# Patient Record
Sex: Female | Born: 1945
Health system: Southern US, Community
[De-identification: ages and names within clinical notes are randomized; demographics above are authoritative.]

## PROBLEM LIST (undated history)

## (undated) DIAGNOSIS — I10 Essential (primary) hypertension: Secondary | ICD-10-CM

## (undated) DIAGNOSIS — K219 Gastro-esophageal reflux disease without esophagitis: Secondary | ICD-10-CM

## (undated) DIAGNOSIS — J302 Other seasonal allergic rhinitis: Secondary | ICD-10-CM

## (undated) DIAGNOSIS — M419 Scoliosis, unspecified: Secondary | ICD-10-CM

## (undated) DIAGNOSIS — F419 Anxiety disorder, unspecified: Secondary | ICD-10-CM

## (undated) DIAGNOSIS — M353 Polymyalgia rheumatica: Secondary | ICD-10-CM

## (undated) DIAGNOSIS — J45909 Unspecified asthma, uncomplicated: Secondary | ICD-10-CM

## (undated) DIAGNOSIS — E785 Hyperlipidemia, unspecified: Secondary | ICD-10-CM

## (undated) DIAGNOSIS — M1990 Unspecified osteoarthritis, unspecified site: Secondary | ICD-10-CM

## (undated) HISTORY — DX: Polymyalgia rheumatica: M35.3

## (undated) HISTORY — PX: ABDOMINAL HYSTERECTOMY: SUR658

## (undated) HISTORY — DX: Unspecified asthma, uncomplicated: J45.909

## (undated) HISTORY — PX: HEMORRHOID SURGERY: SHX153

## (undated) HISTORY — DX: Anxiety disorder, unspecified: F41.9

## (undated) HISTORY — PX: KNEE ARTHROSCOPY: SUR90

## (undated) HISTORY — DX: Hyperlipidemia, unspecified: E78.5

## (undated) HISTORY — DX: Scoliosis, unspecified: M41.9

## (undated) HISTORY — DX: Unspecified osteoarthritis, unspecified site: M19.90

## (undated) HISTORY — DX: Essential (primary) hypertension: I10

## (undated) HISTORY — DX: Other seasonal allergic rhinitis: J30.2

## (undated) HISTORY — DX: Gastro-esophageal reflux disease without esophagitis: K21.9

## (undated) HISTORY — PX: NASAL SINUS SURGERY: SHX719

---

## 1998-10-03 ENCOUNTER — Other Ambulatory Visit: Admission: RE | Admit: 1998-10-03 | Discharge: 1998-10-03 | Payer: Self-pay | Admitting: Obstetrics and Gynecology

## 1999-03-20 ENCOUNTER — Encounter: Admission: RE | Admit: 1999-03-20 | Discharge: 1999-03-20 | Payer: Self-pay | Admitting: Obstetrics & Gynecology

## 1999-03-20 ENCOUNTER — Encounter: Payer: Self-pay | Admitting: Emergency Medicine

## 1999-04-23 ENCOUNTER — Ambulatory Visit (HOSPITAL_BASED_OUTPATIENT_CLINIC_OR_DEPARTMENT_OTHER): Admission: RE | Admit: 1999-04-23 | Discharge: 1999-04-23 | Payer: Self-pay | Admitting: Otolaryngology

## 2000-04-07 ENCOUNTER — Encounter: Payer: Self-pay | Admitting: Emergency Medicine

## 2000-04-07 ENCOUNTER — Encounter: Admission: RE | Admit: 2000-04-07 | Discharge: 2000-04-07 | Payer: Self-pay | Admitting: Emergency Medicine

## 2000-04-07 ENCOUNTER — Ambulatory Visit (HOSPITAL_COMMUNITY): Admission: RE | Admit: 2000-04-07 | Discharge: 2000-04-07 | Payer: Self-pay | Admitting: Otolaryngology

## 2000-04-07 ENCOUNTER — Encounter: Payer: Self-pay | Admitting: Otolaryngology

## 2000-08-02 ENCOUNTER — Emergency Department (HOSPITAL_COMMUNITY): Admission: EM | Admit: 2000-08-02 | Discharge: 2000-08-02 | Payer: Self-pay | Admitting: Emergency Medicine

## 2000-08-04 ENCOUNTER — Emergency Department (HOSPITAL_COMMUNITY): Admission: EM | Admit: 2000-08-04 | Discharge: 2000-08-04 | Payer: Self-pay | Admitting: Internal Medicine

## 2000-08-18 ENCOUNTER — Encounter: Payer: Self-pay | Admitting: Otolaryngology

## 2000-08-18 ENCOUNTER — Encounter: Admission: RE | Admit: 2000-08-18 | Discharge: 2000-08-18 | Payer: Self-pay | Admitting: Otolaryngology

## 2001-06-05 ENCOUNTER — Encounter: Admission: RE | Admit: 2001-06-05 | Discharge: 2001-06-05 | Payer: Self-pay | Admitting: Obstetrics and Gynecology

## 2001-06-05 ENCOUNTER — Encounter: Payer: Self-pay | Admitting: Obstetrics and Gynecology

## 2001-06-11 ENCOUNTER — Encounter: Payer: Self-pay | Admitting: Otolaryngology

## 2001-06-11 ENCOUNTER — Encounter: Admission: RE | Admit: 2001-06-11 | Discharge: 2001-06-11 | Payer: Self-pay | Admitting: Otolaryngology

## 2001-07-27 ENCOUNTER — Ambulatory Visit (HOSPITAL_BASED_OUTPATIENT_CLINIC_OR_DEPARTMENT_OTHER): Admission: RE | Admit: 2001-07-27 | Discharge: 2001-07-27 | Payer: Self-pay | Admitting: Otolaryngology

## 2002-05-18 ENCOUNTER — Encounter: Payer: Self-pay | Admitting: Otolaryngology

## 2002-05-18 ENCOUNTER — Encounter: Admission: RE | Admit: 2002-05-18 | Discharge: 2002-05-18 | Payer: Self-pay | Admitting: Otolaryngology

## 2002-06-28 ENCOUNTER — Encounter: Admission: RE | Admit: 2002-06-28 | Discharge: 2002-06-28 | Payer: Self-pay | Admitting: Obstetrics and Gynecology

## 2002-06-28 ENCOUNTER — Encounter: Payer: Self-pay | Admitting: Obstetrics and Gynecology

## 2003-06-29 ENCOUNTER — Encounter: Admission: RE | Admit: 2003-06-29 | Discharge: 2003-06-29 | Payer: Self-pay | Admitting: Obstetrics and Gynecology

## 2003-08-23 ENCOUNTER — Ambulatory Visit (HOSPITAL_COMMUNITY): Admission: RE | Admit: 2003-08-23 | Discharge: 2003-08-23 | Payer: Self-pay | Admitting: Emergency Medicine

## 2003-09-06 ENCOUNTER — Observation Stay (HOSPITAL_COMMUNITY): Admission: RE | Admit: 2003-09-06 | Discharge: 2003-09-07 | Payer: Self-pay | Admitting: Obstetrics and Gynecology

## 2004-10-11 ENCOUNTER — Emergency Department (HOSPITAL_COMMUNITY): Admission: EM | Admit: 2004-10-11 | Discharge: 2004-10-12 | Payer: Self-pay | Admitting: Emergency Medicine

## 2004-10-24 ENCOUNTER — Encounter: Admission: RE | Admit: 2004-10-24 | Discharge: 2004-10-24 | Payer: Self-pay | Admitting: Obstetrics and Gynecology

## 2005-10-15 ENCOUNTER — Encounter: Admission: RE | Admit: 2005-10-15 | Discharge: 2005-10-15 | Payer: Self-pay | Admitting: Emergency Medicine

## 2005-10-28 ENCOUNTER — Encounter: Admission: RE | Admit: 2005-10-28 | Discharge: 2005-10-28 | Payer: Self-pay | Admitting: Obstetrics and Gynecology

## 2006-08-12 ENCOUNTER — Encounter: Admission: RE | Admit: 2006-08-12 | Discharge: 2006-08-12 | Payer: Self-pay | Admitting: Rheumatology

## 2006-09-01 ENCOUNTER — Encounter: Admission: RE | Admit: 2006-09-01 | Discharge: 2006-09-01 | Payer: Self-pay | Admitting: Rheumatology

## 2006-10-30 ENCOUNTER — Encounter: Admission: RE | Admit: 2006-10-30 | Discharge: 2006-10-30 | Payer: Self-pay | Admitting: Obstetrics and Gynecology

## 2008-01-11 ENCOUNTER — Encounter: Admission: RE | Admit: 2008-01-11 | Discharge: 2008-01-11 | Payer: Self-pay | Admitting: Emergency Medicine

## 2009-01-12 ENCOUNTER — Encounter: Admission: RE | Admit: 2009-01-12 | Discharge: 2009-01-12 | Payer: Self-pay | Admitting: Emergency Medicine

## 2010-02-05 ENCOUNTER — Encounter: Admission: RE | Admit: 2010-02-05 | Discharge: 2010-02-05 | Payer: Self-pay | Admitting: Obstetrics and Gynecology

## 2010-05-01 ENCOUNTER — Other Ambulatory Visit: Payer: Self-pay | Admitting: Rheumatology

## 2010-05-01 DIAGNOSIS — M25562 Pain in left knee: Secondary | ICD-10-CM

## 2010-05-07 ENCOUNTER — Ambulatory Visit
Admission: RE | Admit: 2010-05-07 | Discharge: 2010-05-07 | Disposition: A | Discharge status: Home / Self Care | Payer: BC Managed Care – PPO | Source: Ambulatory Visit | Attending: Rheumatology | Admitting: Rheumatology

## 2010-05-07 DIAGNOSIS — M25562 Pain in left knee: Secondary | ICD-10-CM

## 2010-08-03 NOTE — Op Note (Signed)
[place]. [facility]  Patient:    Joanna Cortez                         MRN: [id number] Proc. Date: 04/23/99 Adm. Date:  [id number] Attending:  Susy Frizzle CC:         Oley Balm. Georgina Pillion, M.D.                           Operative Report  PREOPERATIVE DIAGNOSIS:  Chronic maxillary ethmoid and frontal sinusitis.  POSTOPERATIVE DIAGNOSIS:  Chronic maxillary ethmoid and frontal sinusitis.  PROCEDURES: 1. Bilateral endoscopic frontal sinusotomy. 2. Right maxillary sinus endoscopy with removal of polypoid tissue.  SURGEON:  Jefry H. Pollyann Kennedy, M.D.  ANESTHESIA:  General endotracheal.  COMPLICATIONS:  None.  FINDINGS:  Diffuse polypoid degeneration of the maxillary sinus mucosa on the right side, with bilateral polypoid degeneration of the interethmoid area, and near-complete obstruction of the frontal sinus drainage pathway bilaterally. inspissated mucus filling the right sphenoid sinus.  REFERRING PHYSICIAN:  Oley Balm. Georgina Pillion, M.D.  HISTORY:  A 65 year old with a long history of chronic sinus complaints, chronic sinus infection and asthma, status post extensive sinus surgery and polypectomy  about 2-1/2 years ago; with persistent chronic drainage.  Risks, benefits and alternatives and complications of the procedure were explained to the patient. She seemed to understand and agreed to surgery.  DESCRIPTION OF PROCEDURE:  The patient was taken to the operating room and placed on the operating table in supine position.  Following the induction of general endotracheal anesthesia the patient was draped in the standard fashion. Oxymetazoline spray was used in the nose preoperatively.  The Insta-Track head ear was positioned on the patients head and straight suction probe was connected and calibrated to the system.  Xylocaine 1% with epinephrine was infiltrated in the  superior and posterior attachments of the middle turbinate remnants bilaterally.  1.  BILATERAL ENDOSCOPIC FRONTAL SINUSOTOMY:  Nasal endoscopy revealed the ethmoid cavities were open bilaterally, but there was severe polypoid degeneration of the mucosal surfaces, more so on the right side.  This polypoid tissue was taken down using the suction debrider.  The sphenoidostomy were opened bilaterally, and thick, inspissated mucus was aspirated from the right side.  There were no obstructed ethmoid cells on either side.  The frontal recess area was inspected using 30 and 70-degree endoscopes.  Some polypoid tissue was seen blocking the opening of the frontal sinuses.  On the left side there was a small residual ridge of bone separating the anterior ethmoid area from the frontal sinus ostium. This was taken down using giraffe forceps.  Curved suction was entered into the frontal sinus.  There was no fluid or polypoid material within the frontal sinus. Transillumination was used to assure that the sinus was indeed open.  This was performed on both sides.  On the right side some polypoid tissue was cleaned out of the frontal recess area and the frontal sinus ostium was enlarged using giraffe  forceps.  Care was taken not to damage any of the lining of the ostium.  The frontal sinuses were packed with small pieces of Gelfoam soaked in Cortisporin suspension bilaterally.  At the end of the complete procedure Kennedy ethmoid packs were cut down to about half their normal thickness, and were packed into the frontal sinuses bilaterally.  2. RIGHT MAXILLARY SINUS ENDOSCOPY (with removal of polypoid tissue).  The left  maxillary sinus was wide-open and clear.  The left was filled with polypoid-type tissue.  The ostium itself was cleaned of polypoid tissue, which allowed easy access into the sinus itself.  The suction debrider was used to clean out the polypoid tissue from the antrostomy site.  The suction debrider and giraffe forceps were then used to remove the majority of  the polypoid tissue from the sinus itself. There was minimal bleeding encountered along this dissection.  The nasal cavities were suctioned of blood and secretions, and the pharynx was suctioned of blood nd secretions under direct visualization as well.  The patient was awakened, extubated and transferred to recovery in stable condition.DD:  04/23/99 TD:  04/23/99 Job: [id number]

## 2010-08-03 NOTE — Op Note (Signed)
[name]. [facility]  Patient:    Joanna Cortez, Joanna Cortez Visit Number: [id number] MRN: [id number]          Service Type: DSU Location: [facility] Attending Physician:  Susy Frizzle Dictated by:   Jeannett Senior Pollyann Kennedy, M.D. Proc. Date: 07/27/01 Admit Date:  07/27/2001                             Operative Report  PREOPERATIVE DIAGNOSIS:  Chronic sinusitis.  POSTOPERATIVE DIAGNOSIS:  Chronic sinusitis.  OPERATION PERFORMED:  Nasal endoscopy with minor revision, ethmoidectomy and left antral meatal window.  SURGEON:  Jefry H. Pollyann Kennedy, M.D.  COMPLICATIONS:  None.  ESTIMATED BLOOD LOSS:  15 cc.  OPERATIVE FINDINGS:  Clear open sinus cavities, bilateral maxillary, bilateral frontal, bilateral ethmoid and bilateral sphenoid.  No polypoid disease present today.  Thick cloudy mucoid material within the posterior ethmoid cells bilaterally.  A sample of this was taken for culture, aerobic and anaerobic culture and sensitivity testing.  The patient tolerated the procedure well, was awakened, extubated and transferred to recovery in stable condition.  ANESTHESIA:  INDICATIONS FOR PROCEDURE:  The patient is a 65 year old lady with a history of chronic pansinusitis despite multiple operations and prolonged antibiotic therapy including a two month period of intravenous antibiotic therapy.  The risks, benefits, alternatives and complications of the procedure were explained to the patient, who seemed to understand and agreed to surgery.  DESCRIPTION OF PROCEDURE:  The patient was taken to the operating room and placed on the operating table in the supine position.  Following induction of general endotracheal anesthesia, the face was draped in the standard fashion. Oxymetazoline spray was used preoperatively in the nose.  1% Xylocaine with epinephrine was infiltrated into the middle turbinate remnant on the left side and the lateral nasal wall.  Afrin soaked pledgets were placed  in the nasal and sinus cavities bilaterally.  0 and 30 degree nasal endoscopy was performed.  The above findings were noted.  All sinus cavities were open without obstruction.  There was no polypoid disease.  The thick cloudy mucoid material was suctioned from the  ethmoid sinus on the right side and sent for culture with a Lukens prep.  The left side was simply suctioned out.  Several small fibrotic bands of tissue were taken down using the microdebrider within the ethmoid cavities bilaterally.  None of these were causing any significant obstruction but the feeling was that any advantage that could be obtained by complete opening of the ethmoid cavity could be beneficial.  There was really no bony tissue remaining and no bony work was resected.  The frontal sinuses were widely patent and were inspected using curved long thin suction as well as 30 degree nasal endoscopy.  Sphenoids were wide open bilaterally.  The maxillary sinuses were wide open as well.  On the left side which is where she seems to get the most of the infection and the majority of her symptoms, a nasal antral window was created on the inferior meatus.  1% Xylocaine with epinephrine was infiltrated into the mucosa first.  The curved suction was then entered through the thin bone and back biting forceps and straight through cutting forceps were used to enlarge the new opening.  The inferior turbinate was left in place and was not destabilized.  Afrin pledgets were then placed bilaterally for completion of hemostasis.  The maxillary sinuses bilaterally were packed with Cortisporin ointment.  The patient was then awakened from anesthesia and transferred to recovery in stable condition. Dictated by:   Jeannett Senior Pollyann Kennedy, M.D. Attending Physician:  Susy Frizzle DD:  07/27/01 TD:  07/27/01 Job: [id number]

## 2010-08-03 NOTE — Op Note (Signed)
NAME:  Joanna Cortez, Joanna Cortez NO.:  [id number]   MEDICAL RECORD NO.:  [id number]                   PATIENT TYPE:  OBV   LOCATION:  9319                                 FACILITY:  [facility]   PHYSICIAN:  Zenaida Niece, M.D.             DATE OF BIRTH:  September 27, 1945   DATE OF PROCEDURE:  09/06/2003  DATE OF DISCHARGE:                                 OPERATIVE REPORT   PREOPERATIVE DIAGNOSES:  Pelvic relaxation.   POSTOPERATIVE DIAGNOSES:  Pelvic relaxation.   PROCEDURE:  Anterior and posterior colporrhaphy with enterocele repair and  sacrospinous vaginal vault suspension.   SURGEON:  Zenaida Niece, M.D.   ASSISTANT:  Huel Cote, M.D.   ANESTHESIA:  General endotracheal tube.   ESTIMATED BLOOD LOSS:  100 mL.   FINDINGS:  Grade 2 cystocele and rectocele.  She had an obvious enterocele  and grade 2 vaginal vault prolapse.   DESCRIPTION OF PROCEDURE:  The patient was taken to the operating room and  placed in the dorsal supine position.  General anesthesia was induced and  she was placed in mobile stirrups.  The perineum and vagina were then  prepped and draped in the usual sterile fashion and bladder drained with a  red rubber catheter.  A weighted speculum was inserted into the vagina and  the vaginal apex was grasped with Allis clamps laterally. A small piece of  vaginal tissue was then removed sharply and the vagina was dissected from  the vaginal apex to within 1 cm of the urethral meatus anteriorly in the  midline.  The vagina was then freed up laterally, sharply and bluntly to  mobilize the cystocele.  The cystocele was then reduced with interrupted  sutures of 2-0 Vicryl with good reduction.  Excess vaginal mucosa was  removed and the vagina was then closed from the urethral meatus to the  vaginal cuff with running locking 2-0 Vicryl with good reduction of the  cystocele.   Attention was turned posteriorly.  The hymenal ring was grasped at  a  distance that would allow two fingers to easily pass into the vagina. A  piece of vaginal tissue was then removed at the introitus. The vagina was  then dissected from the hymenal ring to the vaginal apex in the midline.  The vagina was then freed up laterally to mobilize the rectocele.  In doing  so, an obvious enterocele was encountered. This was confirmed by rectal exam  with an over glove.  The enterocele was grasped with Kelly clamps and  entered sharply. The enterocele was mobilized from the surrounding tissue  and a suture of 2-0 silk was placed as high as possible in the enterocele to  close it off. The excess enterocele sac was then removed with  electrocautery.  Attention was turned to the sacrospinous fixation.  The  right rectal pillar was identified and bluntly dissected. The rectum and  perirectal  tissue was pushed medially.  The ischial spine was identified and  all tissue swept off of the sacrospinous ligament which was easily palpated.  Breisky retractors were used and an Allis clamp is used to grasp the  sacrospinous ligament. Two sutures of #1 Prolene were passed through the  ligament, one medial and one lateral to the Allis clamp which was  approximately 2 cm from the ischial spine.  The sutures were seated very  well in the sacrospinous ligament.  The sutures were then passed through the  under portion of the vagina at the vaginal apex with a free Mayo needle.  The lateral stitch was made to be a pulley stitch.  The rectocele was then  reduced with interrupted sutures of 2-0 Vicryl with good reduction. Excess  vaginal mucosa was then removed.  The posterior vaginal mucosa was then  started to be closed in a running locking fashion with 2-0 Vicryl. This was  closed approximately halfway. The pulley stitch was then tied and pulled the  vaginal vault to the sacrospinous ligament. This was done with good  suspension of the vagina. The suture was tied followed by the  other  sacrospinous suture with good suspension of the vagina. The remainder of the  vagina was closed with running locking 2-0 Vicryl with good closure.  Inspection revealed adequate width of the introitus and good suspension of  the vagina to the sacrospinous ligament and good reduction of the cystocele  and rectocele. The vagina was then packed with 2 inch iodoform gauze.  A  Foley catheter was then inserted.  The patient tolerated the procedure well  and was taken down from stirrups, extubated in the operating room and taken  to the recovery room in stable condition. Counts were correct, she received  Ancef 1 g prior to the procedure, she had PAS hose on throughout the  procedure.                                               Zenaida Niece, M.D.    TDM/MEDQ  D:  09/06/2003  T:  09/06/2003  Job:  [id number]

## 2010-08-03 NOTE — H&P (Signed)
NAME:  Joanna Cortez, Joanna Cortez NO.:  [id number]   MEDICAL RECORD NO.:  [id number]                   PATIENT TYPE:  OBV   LOCATION:  NA                                   FACILITY:  [facility]   PHYSICIAN:  Zenaida Niece, M.D.             DATE OF BIRTH:  [birth year]   DATE OF ADMISSION:  DATE OF DISCHARGE:                                HISTORY & PHYSICAL   CHIEF COMPLAINT:  Pelvic relaxation.   HISTORY OF PRESENT ILLNESS:  This is a 65 year old white female, para 3-0-1-  3, whom I saw for an annual exam on April 15 of this year.  At that time she  complained of increasing pelvic pressure and decreasing sexual activity.  On  exam at that point she had a grade 2 cystocele, a grade 3 rectocele, and  vaginal vault prolapse.  All options were discussed with her and she  expressed the desire to proceed with surgical therapy.   PAST OB HISTORY:  Three vaginal deliveries without complications and one  ectopic pregnancy.   PAST SURGICAL HISTORY:  1. Tubal ligation.  2. Right salpingo-oophorectomy.  3. Appendectomy.  4. Total abdominal hysterectomy with left salpingo-oophorectomy.  5. Hemorrhoidectomy.  6. Sinus surgery x3.  7. Treatment of an ectopic pregnancy.   PAST MEDICAL HISTORY:  1. Hypertension.  2. Gastroesophageal reflux disease.  3. Asthma.   CURRENT MEDICATIONS:  1. Zyrtec.  2. Prilosec.  3. Estradiol 1 mg daily.  4. Trazodone.  5. Advair.  6. Diovan.  7. Toprol.  8. Singulair.   ALLERGIES:  ACE INHIBITORS.   SOCIAL HISTORY:  She is married and denies alcohol, tobacco, or drug use.   REVIEW OF SYSTEMS:  She has occasional urinary urge.  She has also recently  had some left leg, right arm, and left face numbness, and has been worked up  for this with an MRI, an echocardiogram, and carotid Dopplers.  She reports  to me that the echo and carotid Dopplers were normal and was waiting for the  results of the MRI.   FAMILY HISTORY:  No GYN or  colon cancer.   PHYSICAL EXAMINATION:  VITAL SIGNS:  Weight is 154, blood pressure 136/80,  pulse 80.  GENERAL:  This is a well-developed, well-nourished, white female in no acute  distress.  NECK:  Supple without lymphadenopathy or thyromegaly.  LUNGS:  Clear to auscultation.  HEART:  Regular rate and rhythm without murmur.  ABDOMEN:  Soft, nontender, nondistended, without palpable masses, and she  does have a vertical scar.  PELVIC:  External genitalia have no lesions.  Vaginal cuff is well-healed,  and as mentioned, she has a grade 2 cystocele, grade 3 rectocele, a probable  enterocele, and a degree of vaginal vault prolapse.  On bimanual and  rectovaginal exams, there are no masses and she is nontender.   ASSESSMENT:  Symptomatic pelvic relaxation.  Surgical and nonsurgical  options have been discussed with the patient and she wishes to proceed with  surgery.  Risks of surgery including bleeding, infection, and damage to  bowels, bladder, or ureters have been discussed and she understands.   PLAN:  Plan is to admit the patient for anterior and posterior colporrhaphy  with possible enterocele repair and possible vaginal vault suspension to the  sacral spinous ligament.                                               Zenaida Niece, M.D.    TDM/MEDQ  D:  09/05/2003  T:  09/05/2003  Job:  [id number]

## 2011-02-20 ENCOUNTER — Other Ambulatory Visit: Payer: Self-pay | Admitting: Family Medicine

## 2011-02-20 DIAGNOSIS — Z1231 Encounter for screening mammogram for malignant neoplasm of breast: Secondary | ICD-10-CM

## 2011-03-18 ENCOUNTER — Ambulatory Visit
Admission: RE | Admit: 2011-03-18 | Discharge: 2011-03-18 | Disposition: A | Discharge status: Home / Self Care | Payer: BC Managed Care – PPO | Source: Ambulatory Visit | Attending: Family Medicine | Admitting: Family Medicine

## 2011-03-18 DIAGNOSIS — Z1231 Encounter for screening mammogram for malignant neoplasm of breast: Secondary | ICD-10-CM

## 2012-04-13 ENCOUNTER — Other Ambulatory Visit: Payer: Self-pay | Admitting: Family Medicine

## 2012-04-13 DIAGNOSIS — Z1231 Encounter for screening mammogram for malignant neoplasm of breast: Secondary | ICD-10-CM

## 2012-05-13 ENCOUNTER — Ambulatory Visit
Admission: RE | Admit: 2012-05-13 | Discharge: 2012-05-13 | Disposition: A | Discharge status: Home / Self Care | Payer: Medicare Other | Source: Ambulatory Visit | Attending: Family Medicine | Admitting: Family Medicine

## 2012-05-13 DIAGNOSIS — Z1231 Encounter for screening mammogram for malignant neoplasm of breast: Secondary | ICD-10-CM

## 2012-08-31 ENCOUNTER — Other Ambulatory Visit: Payer: Self-pay | Admitting: Dermatology

## 2013-10-19 ENCOUNTER — Other Ambulatory Visit: Payer: Self-pay | Admitting: *Deleted

## 2013-10-19 DIAGNOSIS — I471 Supraventricular tachycardia: Secondary | ICD-10-CM

## 2013-10-22 ENCOUNTER — Encounter (INDEPENDENT_AMBULATORY_CARE_PROVIDER_SITE_OTHER): Payer: Medicare Other

## 2013-10-22 ENCOUNTER — Encounter: Payer: Self-pay | Admitting: Radiology

## 2013-10-22 DIAGNOSIS — I471 Supraventricular tachycardia, unspecified: Secondary | ICD-10-CM

## 2013-10-22 NOTE — Progress Notes (Signed)
Patient ID: Joanna Cortez, female   DOB: Jun 18, 1945, 68 y.o.   MRN: [id number] [facility] 48 hr holter applied

## 2013-11-19 ENCOUNTER — Other Ambulatory Visit: Payer: Self-pay | Admitting: Family Medicine

## 2013-11-19 DIAGNOSIS — R519 Headache, unspecified: Secondary | ICD-10-CM

## 2013-11-19 DIAGNOSIS — R51 Headache: Principal | ICD-10-CM

## 2013-12-01 ENCOUNTER — Ambulatory Visit
Admission: RE | Admit: 2013-12-01 | Discharge: 2013-12-01 | Disposition: A | Discharge status: Home / Self Care | Payer: 59 | Source: Ambulatory Visit | Attending: Family Medicine | Admitting: Family Medicine

## 2013-12-01 DIAGNOSIS — R519 Headache, unspecified: Secondary | ICD-10-CM

## 2013-12-01 DIAGNOSIS — R51 Headache: Principal | ICD-10-CM

## 2013-12-01 MED ORDER — GADOBENATE DIMEGLUMINE 529 MG/ML IV SOLN
15.0000 mL | Freq: Once | INTRAVENOUS | Status: AC | PRN
  Administered 2013-12-01: 15 mL via INTRAVENOUS

## 2014-04-15 ENCOUNTER — Encounter: Payer: Self-pay | Admitting: Neurology

## 2014-04-15 ENCOUNTER — Ambulatory Visit (INDEPENDENT_AMBULATORY_CARE_PROVIDER_SITE_OTHER): Payer: Medicare Other | Admitting: Neurology

## 2014-04-15 VITALS — BP 130/82 | HR 86 | Resp 16 | Ht 58.75 in | Wt 167.0 lb

## 2014-04-15 DIAGNOSIS — G4486 Cervicogenic headache: Secondary | ICD-10-CM

## 2014-04-15 DIAGNOSIS — R569 Unspecified convulsions: Secondary | ICD-10-CM

## 2014-04-15 DIAGNOSIS — R51 Headache: Secondary | ICD-10-CM

## 2014-04-15 DIAGNOSIS — IMO0001 Reserved for inherently not codable concepts without codable children: Secondary | ICD-10-CM

## 2014-04-15 DIAGNOSIS — R6889 Other general symptoms and signs: Principal | ICD-10-CM

## 2014-04-15 NOTE — Progress Notes (Signed)
NEUROLOGY CONSULTATION NOTE  Joanna Cortez MRN: [id number] DOB: 20-Mar-1945  Referring provider: Dr. Jonathon Jordan Primary care provider: Dr. Jonathon Jordan  Reason for consult:  Possible seizures  Dear Dr Stephanie Acre:  Thank you for your kind referral of Joanna Cortez for consultation of the above symptoms. Although her history is well known to you, please allow me to reiterate it for the purpose of our medical record. Records and images were personally reviewed where available.  HISTORY OF PRESENT ILLNESS: This is a pleasant 69 year old right-handed woman presenting for evaluation of headaches and possible seizures. She reports the headaches started in early spring, around April/May 2015 when she was having bad headaches going from one ear through her head into the other side, with throbbing pain in the back of her head radiating down her neck. Headaches were occurring on a daily basis, 7 to 8 over 10 in intensity, with some nausea and phonophobia. Excedrin migraine was the only medication that helpd. She was prescribed tizanidine, and notes that she has had only 1 headache since then. She had a different type of head pain on the left side behind her ear radiating down her neck, applying pressure helped, with no recurrence of this. This also helps her sleep. Her 2 daughters have migraines, she denies any prior history of headaches.   She also reports recurrent episodes since the Spring where she would be sitting then suddenly start sweating ("pouring") for 5 minutes. This is not associated with any headaches, no confusion or speech difficulties. She "just feels so hot." One time she had palpitations. A 48-holter monitor was done, results unavailable for review. She reports 2-3 episodes daily this past week. She denies any olfactory/gustatory hallucinations, deja vu, rising epigastric sensation, focal numbness/tingling/weakness, myoclonic jerks, staring/unresponsive episodes. No family history  of similar symptoms.  She has chronic neck and back pain. She has occasional tingling in the bottom of both heels since last week. Her left leg goes numb if she stands in line for a prolonged period, resolving once she walks. She denies any dizziness, diplopia, dysarthria, dysphagia, bowel/bladder dysfunction. She had a normal birth and early development.  There is no history of febrile convulsions, CNS infections such as meningitis/encephalitis, significant traumatic brain injury, neurosurgical procedures, or family history of seizures.  I personally reviewed MRI brain done 11/2013 which did not show any acute changes, there was mild bilateral chronic microvascular change seen. Hippocampi symmetric with no abnormal signal or enhancement.  PAST MEDICAL HISTORY: Past Medical History  Diagnosis Date  . Hyperlipidemia   . Hypertension   . Acid reflux   . Anxiety   . Scoliosis   . PMR (polymyalgia rheumatica)   . Osteoarthritis   . Asthma   . Seasonal allergies     PAST SURGICAL HISTORY: Past Surgical History  Procedure Laterality Date  . Abdominal hysterectomy    . Knee arthroscopy      Left  . Nasal sinus surgery      x 3  . Hemorrhoid surgery      MEDICATIONS: See med list reviewed.   No current facility-administered medications on file prior to visit.    ALLERGIES: Allergies  Allergen Reactions  . Ace Inhibitors Swelling    Mouth Swelling    FAMILY HISTORY: Family History  Problem Relation Age of Onset  . Hypertension Mother   . Hypertension Father   . Hypertension Maternal Grandmother   . Hypertension Maternal Grandfather   . Hypertension Paternal Grandmother   .  Hypertension Paternal Grandfather   . Stroke Maternal Grandmother   . Cancer Mother   . Cancer Father   . Cancer Paternal Grandmother   . Heart attack Maternal Grandfather     SOCIAL HISTORY: History   Social History  . Marital Status: Married    Spouse Name: N/A    Number of Children: N/A    . Years of Education: N/A   Occupational History  . Not on file.   Social History Main Topics  . Smoking status: Never Smoker   . Smokeless tobacco: Not on file  . Alcohol Use: No  . Drug Use: No  . Sexual Activity: Not on file   Other Topics Concern  . Not on file   Social History Narrative  . No narrative on file    REVIEW OF SYSTEMS: Constitutional: No fevers, chills, or sweats, no generalized fatigue, change in appetite Eyes: No visual changes, double vision, eye pain Ear, nose and throat: No hearing loss, ear pain, nasal congestion, sore throat Cardiovascular: No chest pain, palpitations Respiratory:  No shortness of breath at rest or with exertion, wheezes GastrointestinaI: No nausea, vomiting, diarrhea, abdominal pain, fecal incontinence Genitourinary:  No dysuria, urinary retention or frequency Musculoskeletal:  + neck pain, back pain Integumentary: No rash, pruritus, skin lesions Neurological: as above Psychiatric: No depression, insomnia, anxiety Endocrine: No palpitations, fatigue, +diaphoresis,no mood swings, change in appetite, change in weight, increased thirst Hematologic/Lymphatic:  No anemia, purpura, petechiae. Allergic/Immunologic: no itchy/runny eyes, nasal congestion, recent allergic reactions, rashes  PHYSICAL EXAM: Filed Vitals:   04/15/14 1045  BP: 130/82  Pulse: 86  Resp: 16   General: No acute distress Head:  Normocephalic/atraumatic Eyes: Fundoscopic exam shows bilateral sharp discs, no vessel changes, exudates, or hemorrhages Neck: supple, no paraspinal tenderness, full range of motion Back: No paraspinal tenderness Heart: regular rate and rhythm Lungs: Clear to auscultation bilaterally. Vascular: No carotid bruits. Skin/Extremities: No rash, no edema Neurological Exam: Mental status: alert and oriented to person, place, and time, no dysarthria or aphasia, Fund of knowledge is appropriate.  Recent and remote memory are intact.   Attention and concentration are normal.    Able to name objects and repeat phrases. Cranial nerves: CN I: not tested CN II: pupils equal, round and reactive to light, visual fields intact, fundi unremarkable. CN III, IV, VI:  full range of motion, no nystagmus, no ptosis CN V: facial sensation intact CN VII: upper and lower face symmetric CN VIII: hearing intact to finger rub CN IX, X: gag intact, uvula midline CN XI: sternocleidomastoid and trapezius muscles intact CN XII: tongue midline Bulk & Tone: normal, no fasciculations. Motor: 5/5 throughout with no pronator drift. Sensation: intact to light touch, cold, pin, vibration and joint position sense.  No extinction to double simultaneous stimulation.  Romberg test negative Deep Tendon Reflexes: +2 throughout, no ankle clonus Plantar responses: downgoing bilaterally Cerebellar: no incoordination on finger to nose, heel to shin. No dysdiadochokinesia Gait: narrow-based and steady, able to tandem walk adequately. Tremor: none  IMPRESSION: This is a pleasant 69 year old right-handed woman with a history of hypertension, hyperlipidemia, depression, presenting for evaluation of headaches and recurrent diaphoretic episodes. The headaches are suggestive of cervicogenic headaches, she has had good response to Tizanidine. The recurrent diaphoretic episodes are of unclear etiology, although recurrent symptoms may occur with seizures, she has no other clear epilepsy risk factors or seizure-like symptoms. Routine EEG will be ordered to assess for focal abnormalities that increase risk for  recurrent seizures. She will follow-up in 3 months and knows to call our office for any change in symptoms.   Thank you for allowing me to participate in the care of this patient. Please do not hesitate to call for any questions or concerns.   Ellouise Newer, M.D.  CC: Dr. Stephanie Acre

## 2014-04-15 NOTE — Patient Instructions (Signed)
1. Schedule routine EEG 2. Continue working with Dr. Trudie Reed for possible hormonal cause of symptoms 3. Follow-up in 3 months, call for any change in symptoms

## 2014-04-26 ENCOUNTER — Ambulatory Visit (INDEPENDENT_AMBULATORY_CARE_PROVIDER_SITE_OTHER): Payer: Medicare Other | Admitting: Neurology

## 2014-04-26 DIAGNOSIS — IMO0001 Reserved for inherently not codable concepts without codable children: Secondary | ICD-10-CM

## 2014-04-26 DIAGNOSIS — R569 Unspecified convulsions: Secondary | ICD-10-CM

## 2014-04-26 DIAGNOSIS — R6889 Other general symptoms and signs: Principal | ICD-10-CM

## 2014-05-03 NOTE — Procedures (Signed)
ELECTROENCEPHALOGRAM REPORT  Date of Study: 04/26/2014  Patient's Name: Joanna Cortez MRN: [id number] Date of Birth: 06/01/1945  Referring Provider: Dr. Ellouise Newer  Clinical History: This is a 69 year old woman with a history of recurrent diaphoretic episodes. EEG for classification  Medications: Xanax, aspirin, Cymbalta, Toprol, Zocor  Technical Summary: A multichannel digital EEG recording measured by the international 10-20 system with electrodes applied with paste and impedances below 5000 ohms performed in our laboratory with EKG monitoring in an awake and asleep patient.  Hyperventilation and photic stimulation were performed.  The digital EEG was referentially recorded, reformatted, and digitally filtered in a variety of bipolar and referential montages for optimal display.    Description: The patient is awake and asleep during the recording.  During maximal wakefulness, there is a symmetric, medium voltage 9 Hz posterior dominant rhythm that attenuates with eye opening.  The record is symmetric. There is an excess amount of diffuse low voltage beta activity seen throughout the recording. During drowsiness and stage I sleep, there is an increase in theta slowing of the background, with shifting asymmetry over the bilateral temporal regions, at times sharply contoured without clear epileptogenic potential.  Vertex waves were seen.  Hyperventilation and photic stimulation did not elicit any abnormalities.  There were no epileptiform discharges or electrographic seizures seen.    EKG lead was unremarkable.  Impression: This awake and asleep EEG is within normal limits.   Clinical Correlation: Diffuse low voltage beta activity is commonly seen with sedating medications such as benzodiazepines.  In the absence of sedating medications, anxiety and hyperthyroidism may produce generalized beta activity.  The absence of epileptiform discharges does not exclude a clinical diagnosis of  epilepsy.  If further clinical questions remain, prolonged EEG may be helpful.  Clinical correlation is advised.   Ellouise Newer, M.D.

## 2014-05-05 ENCOUNTER — Telehealth: Payer: Self-pay | Admitting: Neurology

## 2014-05-05 NOTE — Telephone Encounter (Signed)
Pt called wanting to speak to a nurse regarding her EEG results. C/b [phone]

## 2014-05-05 NOTE — Telephone Encounter (Signed)
Returned patient's call. Notified of normal EEG results.

## 2014-07-15 ENCOUNTER — Ambulatory Visit: Payer: Medicare Other | Admitting: Neurology

## 2014-09-22 ENCOUNTER — Other Ambulatory Visit: Payer: Self-pay

## 2014-09-22 DIAGNOSIS — Z1231 Encounter for screening mammogram for malignant neoplasm of breast: Secondary | ICD-10-CM

## 2014-09-27 ENCOUNTER — Ambulatory Visit
Admission: RE | Admit: 2014-09-27 | Discharge: 2014-09-27 | Disposition: A | Discharge status: Home / Self Care | Payer: Medicare Other | Source: Ambulatory Visit

## 2014-09-27 DIAGNOSIS — Z1231 Encounter for screening mammogram for malignant neoplasm of breast: Secondary | ICD-10-CM

## 2017-02-10 ENCOUNTER — Other Ambulatory Visit: Payer: Self-pay | Admitting: Family Medicine

## 2017-02-10 DIAGNOSIS — Z1231 Encounter for screening mammogram for malignant neoplasm of breast: Secondary | ICD-10-CM

## 2017-03-06 ENCOUNTER — Ambulatory Visit
Admission: RE | Admit: 2017-03-06 | Discharge: 2017-03-06 | Disposition: A | Discharge status: Home / Self Care | Payer: Medicare Other | Source: Ambulatory Visit | Attending: Family Medicine | Admitting: Family Medicine

## 2017-03-06 DIAGNOSIS — Z1231 Encounter for screening mammogram for malignant neoplasm of breast: Secondary | ICD-10-CM

## 2019-04-14 ENCOUNTER — Ambulatory Visit: Payer: Medicare Other

## 2019-04-24 ENCOUNTER — Ambulatory Visit: Payer: Medicare PPO | Attending: Internal Medicine

## 2019-04-24 DIAGNOSIS — Z23 Encounter for immunization: Secondary | ICD-10-CM | POA: Insufficient documentation

## 2019-04-24 NOTE — Progress Notes (Signed)
   Covid-19 Vaccination Clinic  Name:  SHUNDREKA OFFORD    MRN: [id number] DOB: 02/13/1946  04/24/2019  Ms. Shells was observed post Covid-19 immunization for 15 minutes without incidence. She was provided with Vaccine Information Sheet and instruction to access the V-Safe system.   Ms. Guia was instructed to call 911 with any severe reactions post vaccine: Marland Kitchen Difficulty breathing  . Swelling of your face and throat  . A fast heartbeat  . A bad rash all over your body  . Dizziness and weakness    Immunizations Administered    Name Date Dose VIS Date Route   Pfizer COVID-19 Vaccine 04/24/2019  9:13 AM 0.3 mL 02/26/2019 Intramuscular   Manufacturer: [place]   Lot: [id number]   [place]: [id number]

## 2019-05-18 ENCOUNTER — Ambulatory Visit: Payer: Self-pay

## 2019-05-18 ENCOUNTER — Ambulatory Visit: Payer: Medicare PPO | Attending: Internal Medicine

## 2019-05-18 DIAGNOSIS — Z23 Encounter for immunization: Secondary | ICD-10-CM | POA: Insufficient documentation

## 2019-05-18 NOTE — Progress Notes (Signed)
   Covid-19 Vaccination Clinic  Name:  Joanna Cortez    MRN: [id number] DOB: Mar 29, 1945  05/18/2019  [name] was observed post Covid-19 immunization for 15 minutes without incident. She was provided with Vaccine Information Sheet and instruction to access the V-Safe system.   [name] was instructed to call 911 with any severe reactions post vaccine: Marland Kitchen Difficulty breathing  . Swelling of face and throat  . A fast heartbeat  . A bad rash all over body  . Dizziness and weakness   Immunizations Administered    Name Date Dose VIS Date Route   Pfizer COVID-19 Vaccine 05/18/2019  4:08 PM 0.3 mL 02/26/2019 Intramuscular   Manufacturer: [place]   Lot: [id number]   [place]: [id number]

## 2019-11-17 DIAGNOSIS — J0110 Acute frontal sinusitis, unspecified: Secondary | ICD-10-CM | POA: Diagnosis not present

## 2019-11-17 DIAGNOSIS — J4541 Moderate persistent asthma with (acute) exacerbation: Secondary | ICD-10-CM | POA: Diagnosis not present

## 2019-12-21 DIAGNOSIS — J4540 Moderate persistent asthma, uncomplicated: Secondary | ICD-10-CM | POA: Diagnosis not present

## 2020-01-12 DIAGNOSIS — G4700 Insomnia, unspecified: Secondary | ICD-10-CM | POA: Diagnosis not present

## 2020-01-12 DIAGNOSIS — J45901 Unspecified asthma with (acute) exacerbation: Secondary | ICD-10-CM | POA: Diagnosis not present

## 2020-02-08 DIAGNOSIS — L821 Other seborrheic keratosis: Secondary | ICD-10-CM | POA: Diagnosis not present

## 2020-02-08 DIAGNOSIS — D1801 Hemangioma of skin and subcutaneous tissue: Secondary | ICD-10-CM | POA: Diagnosis not present

## 2020-02-08 DIAGNOSIS — D225 Melanocytic nevi of trunk: Secondary | ICD-10-CM | POA: Diagnosis not present

## 2020-02-08 DIAGNOSIS — D2272 Melanocytic nevi of left lower limb, including hip: Secondary | ICD-10-CM | POA: Diagnosis not present

## 2020-02-08 DIAGNOSIS — D2262 Melanocytic nevi of left upper limb, including shoulder: Secondary | ICD-10-CM | POA: Diagnosis not present

## 2020-03-21 DIAGNOSIS — J45901 Unspecified asthma with (acute) exacerbation: Secondary | ICD-10-CM | POA: Diagnosis not present

## 2020-07-12 DIAGNOSIS — R0609 Other forms of dyspnea: Secondary | ICD-10-CM | POA: Diagnosis not present

## 2020-07-13 ENCOUNTER — Other Ambulatory Visit: Payer: Self-pay | Admitting: Family Medicine

## 2020-07-13 ENCOUNTER — Ambulatory Visit
Admission: RE | Admit: 2020-07-13 | Discharge: 2020-07-13 | Disposition: A | Discharge status: Home / Self Care | Payer: Medicare PPO | Source: Ambulatory Visit | Attending: Family Medicine | Admitting: Family Medicine

## 2020-07-13 DIAGNOSIS — R0600 Dyspnea, unspecified: Secondary | ICD-10-CM

## 2020-07-13 DIAGNOSIS — R000 Tachycardia, unspecified: Secondary | ICD-10-CM | POA: Diagnosis not present

## 2020-07-13 DIAGNOSIS — R0602 Shortness of breath: Secondary | ICD-10-CM | POA: Diagnosis not present

## 2020-07-13 DIAGNOSIS — R0609 Other forms of dyspnea: Secondary | ICD-10-CM

## 2020-07-14 DIAGNOSIS — R0600 Dyspnea, unspecified: Secondary | ICD-10-CM | POA: Diagnosis not present

## 2020-07-24 DIAGNOSIS — M353 Polymyalgia rheumatica: Secondary | ICD-10-CM | POA: Diagnosis not present

## 2020-07-24 DIAGNOSIS — I10 Essential (primary) hypertension: Secondary | ICD-10-CM | POA: Diagnosis not present

## 2020-07-24 DIAGNOSIS — J45901 Unspecified asthma with (acute) exacerbation: Secondary | ICD-10-CM | POA: Diagnosis not present

## 2020-07-24 DIAGNOSIS — E559 Vitamin D deficiency, unspecified: Secondary | ICD-10-CM | POA: Diagnosis not present

## 2020-07-24 DIAGNOSIS — K219 Gastro-esophageal reflux disease without esophagitis: Secondary | ICD-10-CM | POA: Diagnosis not present

## 2020-07-24 DIAGNOSIS — Z0000 Encounter for general adult medical examination without abnormal findings: Secondary | ICD-10-CM | POA: Diagnosis not present

## 2020-07-24 DIAGNOSIS — E785 Hyperlipidemia, unspecified: Secondary | ICD-10-CM | POA: Diagnosis not present

## 2020-07-24 DIAGNOSIS — I679 Cerebrovascular disease, unspecified: Secondary | ICD-10-CM | POA: Diagnosis not present

## 2020-07-24 DIAGNOSIS — R002 Palpitations: Secondary | ICD-10-CM | POA: Diagnosis not present

## 2020-08-07 DIAGNOSIS — Z79899 Other long term (current) drug therapy: Secondary | ICD-10-CM | POA: Diagnosis not present

## 2020-08-07 DIAGNOSIS — D649 Anemia, unspecified: Secondary | ICD-10-CM | POA: Diagnosis not present

## 2020-08-07 DIAGNOSIS — E785 Hyperlipidemia, unspecified: Secondary | ICD-10-CM | POA: Diagnosis not present

## 2020-08-07 DIAGNOSIS — E559 Vitamin D deficiency, unspecified: Secondary | ICD-10-CM | POA: Diagnosis not present

## 2020-08-07 DIAGNOSIS — Z1211 Encounter for screening for malignant neoplasm of colon: Secondary | ICD-10-CM | POA: Diagnosis not present

## 2020-08-07 DIAGNOSIS — M109 Gout, unspecified: Secondary | ICD-10-CM | POA: Diagnosis not present

## 2020-08-08 ENCOUNTER — Other Ambulatory Visit: Payer: Self-pay | Admitting: Family Medicine

## 2020-08-08 DIAGNOSIS — K449 Diaphragmatic hernia without obstruction or gangrene: Secondary | ICD-10-CM

## 2020-08-10 DIAGNOSIS — E538 Deficiency of other specified B group vitamins: Secondary | ICD-10-CM | POA: Diagnosis not present

## 2020-08-15 ENCOUNTER — Ambulatory Visit
Admission: RE | Admit: 2020-08-15 | Discharge: 2020-08-15 | Disposition: A | Discharge status: Home / Self Care | Payer: Medicare PPO | Source: Ambulatory Visit | Attending: Family Medicine | Admitting: Family Medicine

## 2020-08-15 ENCOUNTER — Encounter (INDEPENDENT_AMBULATORY_CARE_PROVIDER_SITE_OTHER): Payer: Self-pay

## 2020-08-15 DIAGNOSIS — R079 Chest pain, unspecified: Secondary | ICD-10-CM | POA: Diagnosis not present

## 2020-08-15 DIAGNOSIS — K449 Diaphragmatic hernia without obstruction or gangrene: Secondary | ICD-10-CM

## 2020-08-15 DIAGNOSIS — R1310 Dysphagia, unspecified: Secondary | ICD-10-CM | POA: Diagnosis not present

## 2020-09-08 DIAGNOSIS — K449 Diaphragmatic hernia without obstruction or gangrene: Secondary | ICD-10-CM | POA: Diagnosis not present

## 2020-09-08 DIAGNOSIS — D649 Anemia, unspecified: Secondary | ICD-10-CM | POA: Diagnosis not present

## 2020-09-08 DIAGNOSIS — E538 Deficiency of other specified B group vitamins: Secondary | ICD-10-CM | POA: Diagnosis not present

## 2020-09-10 DIAGNOSIS — R102 Pelvic and perineal pain: Secondary | ICD-10-CM | POA: Diagnosis not present

## 2020-09-10 DIAGNOSIS — N3001 Acute cystitis with hematuria: Secondary | ICD-10-CM | POA: Diagnosis not present

## 2020-09-12 ENCOUNTER — Other Ambulatory Visit: Payer: Self-pay | Admitting: *Deleted

## 2020-09-12 ENCOUNTER — Ambulatory Visit (INDEPENDENT_AMBULATORY_CARE_PROVIDER_SITE_OTHER): Payer: Medicare PPO

## 2020-09-12 ENCOUNTER — Encounter: Payer: Self-pay | Admitting: *Deleted

## 2020-09-12 DIAGNOSIS — R000 Tachycardia, unspecified: Secondary | ICD-10-CM

## 2020-09-12 DIAGNOSIS — R002 Palpitations: Secondary | ICD-10-CM

## 2020-09-12 NOTE — Progress Notes (Unsigned)
Original referral cancelled in error.  Order entered 09/12/20.  Patient enrolled for [employer] to mail a 7 day ZIO XT monitor to address on file.  Letter with instructions mailed to patient.

## 2020-09-14 DIAGNOSIS — R002 Palpitations: Secondary | ICD-10-CM | POA: Diagnosis not present

## 2020-09-14 DIAGNOSIS — R000 Tachycardia, unspecified: Secondary | ICD-10-CM

## 2020-09-15 ENCOUNTER — Encounter: Payer: Self-pay | Admitting: Cardiovascular Disease

## 2020-09-15 ENCOUNTER — Other Ambulatory Visit: Payer: Self-pay

## 2020-09-15 ENCOUNTER — Ambulatory Visit: Payer: Medicare PPO | Admitting: Cardiovascular Disease

## 2020-09-15 DIAGNOSIS — R0600 Dyspnea, unspecified: Secondary | ICD-10-CM

## 2020-09-15 DIAGNOSIS — E782 Mixed hyperlipidemia: Secondary | ICD-10-CM | POA: Diagnosis not present

## 2020-09-15 DIAGNOSIS — E785 Hyperlipidemia, unspecified: Secondary | ICD-10-CM | POA: Insufficient documentation

## 2020-09-15 DIAGNOSIS — R002 Palpitations: Secondary | ICD-10-CM | POA: Insufficient documentation

## 2020-09-15 DIAGNOSIS — R0609 Other forms of dyspnea: Secondary | ICD-10-CM | POA: Insufficient documentation

## 2020-09-15 NOTE — Assessment & Plan Note (Signed)
Recent history of increased heart rate with minimal exertion associated with shortness of breath as well.  She is wearing a 2-week Zio patch ordered by her PCP.

## 2020-09-15 NOTE — Assessment & Plan Note (Signed)
History of reactive airways disease on inhaled bronchodilators.  She is noticed markedly increased dyspnea over the last several months for unclear reasons.  This is not associate with chest pain.  I am going to get a 2D echocardiogram and an upright Lexiscan Myoview (patient is very claustrophobic) to rule out an ischemic etiology.

## 2020-09-15 NOTE — Assessment & Plan Note (Signed)
History of hyperlipidemia not on statin therapy because of statin intolerance with lipid profile performed 08/07/2020 revealing total cholesterol 233, LDL 133 and HDL 55.  We will further evaluate her for CAD.  If she has this we will refer her for alternative therapy such as PCSK9.

## 2020-09-15 NOTE — Patient Instructions (Signed)
  Testing/Procedures:  Your physician has requested that you have an echocardiogram. Echocardiography is a painless test that uses sound waves to create images of your heart. It provides your doctor with information about the size and shape of your heart and how well your heart's chambers and valves are working. This procedure takes approximately one hour. There are no restrictions for this procedure. [place] has requested that you have a lexiscan myoview. For further information please visit [email or web address]. Please follow instruction sheet, as given. IXL   Follow-Up: At [facility], you and your health needs are our priority.  As part of our continuing mission to provide you with exceptional heart care, we have created designated Provider Care Teams.  These Care Teams include your primary Cardiologist (physician) and Advanced Practice Providers (APPs -  Physician Assistants and Nurse Practitioners) who all work together to provide you with the care you need, when you need it.  We recommend signing up for the patient portal called "MyChart".  Sign up information is provided on this After Visit Summary.  MyChart is used to connect with patients for Virtual Visits (Telemedicine).  Patients are able to view lab/test results, encounter notes, upcoming appointments, etc.  Non-urgent messages can be sent to your provider as well.   To learn more about what you can do with MyChart, go to [email or web address].    Your next appointment:   1 month(s)  The format for your next appointment:   In Person  Provider:   Quay Burow, MD

## 2020-09-15 NOTE — Progress Notes (Signed)
03/19/2480 ANNSLEE TERCERO   [birth year]  [id number]  Primary Physician Jonathon Jordan, MD Primary Cardiologist: Lorretta Harp MD Lupe Carney, [place]  HPI:  Joanna Cortez is a 75 y.o. moderately overweight widowed Caucasian female mother of 3 daughters, grandmother of 5 grandchildren referred by her PCP, Dr. Jonathon Jordan, for evaluation of increasing dyspnea and tachypalpitations.  Ms. Jacobs [place].  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  Her risk factors include untreated hyperlipidemia because of statin intolerance, treated hypertension.  There is no family history.  She does have a history of GERD as well.  She denies chest pain but has had increasing dyspnea on exertion over the last several months associate with tachypalpitations.  She is currently wearing a Zio patch.   Current Meds  Medication Sig   albuterol (PROVENTIL HFA;VENTOLIN HFA) 108 (90 BASE) MCG/ACT inhaler Inhale 2 puffs into the lungs every 6 (six) hours as needed for wheezing or shortness of breath.   aspirin 81 MG tablet Take 81 mg by mouth daily.   azelastine (ASTELIN) 0.1 % nasal spray Place 2 sprays into both nostrils daily. Use in each nostril as directed   budesonide-formoterol (SYMBICORT) 160-4.5 MCG/ACT inhaler Inhale 2 puffs into the lungs 2 (two) times daily.   calcium carbonate (OS-CAL) 600 MG tablet Take 600 mg by mouth daily.   Cholecalciferol (VITAMIN D-3) 1000 UNITS CAPS Take by mouth. Takes 2 capsules daily   dexlansoprazole (DEXILANT) 60 MG capsule Take 60 mg by mouth daily.   DULoxetine (CYMBALTA) 60 MG capsule Take 60 mg by mouth daily.   Fe Fum-FePoly-Vit C-Vit B3 (INTEGRA) 62.5-62.5-40-3 MG CAPS Take by mouth.   Garlic 0388 MG CAPS Take by mouth.   metoprolol succinate (TOPROL-XL) 100 MG 24 hr tablet Take 100 mg by mouth daily. Take with or immediately following a meal.   Misc Natural Products (GLUCOSAMINE CHOND  COMPLEX/MSM PO) Take by mouth. Takes 2 tablets daily   montelukast (SINGULAIR) 10 MG tablet Take 10 mg by mouth at bedtime.   Omega-3 Fatty Acids (FISH OIL) 1000 MG CAPS Take by mouth. Takes 2 tablets twice daily   Tart Cherry 1200 MG CAPS Take by mouth.   traZODone (DESYREL) 100 MG tablet Take 100 mg by mouth at bedtime.   vitamin B-12 (CYANOCOBALAMIN) 1000 MCG tablet Take 1,000 mcg by mouth daily.   [DISCONTINUED] ALPRAZolam (XANAX) 0.25 MG tablet Take 0.25 mg by mouth daily.   [DISCONTINUED] estradiol (ESTRACE) 0.5 MG tablet Take 0.5 mg by mouth daily.   [DISCONTINUED] folic acid (FOLVITE) 828 MCG tablet Take 400 mcg by mouth daily.   [DISCONTINUED] simvastatin (ZOCOR) 20 MG tablet Take 20 mg by mouth daily.     Allergies  Allergen Reactions   Ace Inhibitors Swelling    Mouth Swelling    Social History   Socioeconomic History   Marital status: Married    Spouse name: Not on file   Number of children: Not on file   Years of education: Not on file   Highest education level: Not on file  Occupational History   Not on file  Tobacco Use   Smoking status: Never   Smokeless tobacco: Not on file  Substance and Sexual Activity   Alcohol use: No    Alcohol/week: 0.0 standard drinks   Drug use: No   Sexual activity: Not on file  Other Topics Concern   Not on file  Social History Narrative   Not on file   Social Determinants of Health   Financial Resource Strain: Not on file  Food Insecurity: Not on file  Transportation Needs: Not on file  Physical Activity: Not on file  Stress: Not on file  Social Connections: Not on file  Intimate Partner Violence: Not on file     Review of Systems: General: negative for chills, fever, night sweats or weight changes.  Cardiovascular: negative for chest pain, dyspnea on exertion, edema, orthopnea, palpitations, paroxysmal nocturnal dyspnea or shortness of breath Dermatological: negative for rash Respiratory: negative for cough or  wheezing Urologic: negative for hematuria Abdominal: negative for nausea, vomiting, diarrhea, bright red blood per rectum, melena, or hematemesis Neurologic: negative for visual changes, syncope, or dizziness All other systems reviewed and are otherwise negative except as noted above.    Blood pressure 120/76, pulse 88, height 5\' 1"  (1.549 m), weight 156 lb 3.2 oz (70.9 kg), SpO2 94 %.  General appearance: alert and no distress Neck: no adenopathy, no carotid bruit, no JVD, supple, symmetrical, trachea midline, and thyroid not enlarged, symmetric, no tenderness/mass/nodules Lungs: clear to auscultation bilaterally Heart: regular rate and rhythm, S1, S2 normal, no murmur, click, rub or gallop Extremities: extremities normal, atraumatic, no cyanosis or edema Pulses: 2+ and symmetric Skin: Skin color, texture, turgor normal. No rashes or lesions Neurologic: Grossly normal  EKG sinus rhythm at 88 without ST or T wave changes.  I personally reviewed this EKG.  ASSESSMENT AND PLAN:   Hyperlipidemia History of hyperlipidemia not on statin therapy because of statin intolerance with lipid profile performed 08/07/2020 revealing total cholesterol 233, LDL 133 and HDL 55.  We will further evaluate her for CAD.  If she has this we will refer her for alternative therapy such as PCSK9.  Dyspnea on exertion History of reactive airways disease on inhaled bronchodilators.  She is noticed markedly increased dyspnea over the last several months for unclear reasons.  This is not associate with chest pain.  I am going to get a 2D echocardiogram and an upright Lexiscan Myoview (patient is very claustrophobic) to rule out an ischemic etiology.  Palpitations Recent history of increased heart rate with minimal exertion associated with shortness of breath as well.  She is wearing a 2-week Zio patch ordered by her PCP.     Lorretta Harp MD [place], [facility] 09/15/2020 2:52 PM

## 2020-09-28 DIAGNOSIS — R000 Tachycardia, unspecified: Secondary | ICD-10-CM | POA: Diagnosis not present

## 2020-09-28 DIAGNOSIS — R002 Palpitations: Secondary | ICD-10-CM | POA: Diagnosis not present

## 2020-10-05 ENCOUNTER — Telehealth (HOSPITAL_COMMUNITY): Payer: Self-pay

## 2020-10-05 NOTE — Telephone Encounter (Signed)
Detailed instructions left on the patient's answering machine. Asked to call back with any questions. S.Alfons Sulkowski EMTP 

## 2020-10-10 ENCOUNTER — Ambulatory Visit (HOSPITAL_BASED_OUTPATIENT_CLINIC_OR_DEPARTMENT_OTHER): Payer: Medicare PPO

## 2020-10-10 ENCOUNTER — Other Ambulatory Visit: Payer: Self-pay

## 2020-10-10 ENCOUNTER — Ambulatory Visit (HOSPITAL_COMMUNITY): Payer: Medicare PPO | Attending: Internal Medicine

## 2020-10-10 DIAGNOSIS — R0609 Other forms of dyspnea: Secondary | ICD-10-CM

## 2020-10-10 DIAGNOSIS — R0600 Dyspnea, unspecified: Secondary | ICD-10-CM | POA: Insufficient documentation

## 2020-10-10 LAB — MYOCARDIAL PERFUSION IMAGING
LV dias vol: 54 mL (ref 46–106)
LV sys vol: 15 mL
Peak HR: 90 {beats}/min
Rest HR: 65 {beats}/min
SDS: 0
SRS: 0
SSS: 0
TID: 0.9

## 2020-10-10 LAB — ECHOCARDIOGRAM COMPLETE
Area-P 1/2: 4.21 cm2
S' Lateral: 2.5 cm

## 2020-10-10 MED ORDER — TECHNETIUM TC 99M TETROFOSMIN IV KIT
32.9000 | PACK | Freq: Once | INTRAVENOUS | Status: AC | PRN
  Administered 2020-10-10: 32.9 via INTRAVENOUS
  Filled 2020-10-10: qty 33

## 2020-10-10 MED ORDER — TECHNETIUM TC 99M TETROFOSMIN IV KIT
11.0000 | PACK | Freq: Once | INTRAVENOUS | Status: AC | PRN
  Administered 2020-10-10: 11 via INTRAVENOUS
  Filled 2020-10-10: qty 11

## 2020-10-10 MED ORDER — REGADENOSON 0.4 MG/5ML IV SOLN
0.4000 mg | Freq: Once | INTRAVENOUS | Status: AC
  Administered 2020-10-10: 0.4 mg via INTRAVENOUS

## 2020-10-11 ENCOUNTER — Encounter: Payer: Self-pay | Admitting: *Deleted

## 2020-10-12 ENCOUNTER — Encounter: Payer: Self-pay | Admitting: *Deleted

## 2020-10-12 NOTE — Telephone Encounter (Addendum)
-----   Message ----- From: Lorretta Harp, MD Sent: 10/10/2020   3:51 PM EDT  Regarding echo, Essentially normal study. Repeat when clinically indicated.     Lorretta Harp, MD  10/10/2020  3:52 PM EDT      Low risk nonischemic Myoview    Left message for pt to call

## 2020-10-12 NOTE — Telephone Encounter (Signed)
This encounter was created in error - please disregard.

## 2020-10-13 ENCOUNTER — Encounter: Payer: Self-pay | Admitting: *Deleted

## 2020-10-13 NOTE — Telephone Encounter (Signed)
This encounter was created in error - please disregard.

## 2020-10-27 ENCOUNTER — Other Ambulatory Visit: Payer: Self-pay

## 2020-10-27 ENCOUNTER — Ambulatory Visit: Payer: Medicare PPO | Admitting: Cardiovascular Disease

## 2020-10-27 ENCOUNTER — Encounter: Payer: Self-pay | Admitting: Cardiovascular Disease

## 2020-10-27 VITALS — BP 146/75 | HR 77 | Ht 59.0 in | Wt 151.6 lb

## 2020-10-27 DIAGNOSIS — R002 Palpitations: Secondary | ICD-10-CM

## 2020-10-27 NOTE — Progress Notes (Addendum)
Joanna Cortez returns today for follow-up of her outpatient diagnostic test performed in the evaluation of dyspnea and palpitations.  Her Myoview stress test performed 10/10/2020 was entirely normal as was her 2D echo performed 10/10/2020.  Her event monitor showed occasional PACs, PVCs and short runs of SVT.  Her palpitations and shortness of breath has since resolved.  She does have mild hyperlipidemia with lipid profile performed 12/20/2020 revealed a total cholesterol of 282, LDL 165 and HDL of 52.  We talked about starting a statin drug which she is hesitant to do.  I will see her back in 6 months for follow-up.  Lorretta Harp, M.D., [place], [facility], [place] [address] [place], [place]  [address]  [phone] 10/27/2020 11:02 AM

## 2020-10-27 NOTE — Patient Instructions (Signed)

## 2020-12-11 DIAGNOSIS — J0100 Acute maxillary sinusitis, unspecified: Secondary | ICD-10-CM | POA: Diagnosis not present

## 2020-12-20 DIAGNOSIS — E785 Hyperlipidemia, unspecified: Secondary | ICD-10-CM | POA: Diagnosis not present

## 2020-12-20 DIAGNOSIS — D179 Benign lipomatous neoplasm, unspecified: Secondary | ICD-10-CM | POA: Diagnosis not present

## 2020-12-20 DIAGNOSIS — F330 Major depressive disorder, recurrent, mild: Secondary | ICD-10-CM | POA: Diagnosis not present

## 2020-12-26 DIAGNOSIS — H5201 Hypermetropia, right eye: Secondary | ICD-10-CM | POA: Diagnosis not present

## 2020-12-26 DIAGNOSIS — H18513 Endothelial corneal dystrophy, bilateral: Secondary | ICD-10-CM | POA: Diagnosis not present

## 2020-12-26 DIAGNOSIS — H52203 Unspecified astigmatism, bilateral: Secondary | ICD-10-CM | POA: Diagnosis not present

## 2020-12-26 DIAGNOSIS — Z961 Presence of intraocular lens: Secondary | ICD-10-CM | POA: Diagnosis not present

## 2021-01-03 ENCOUNTER — Other Ambulatory Visit: Payer: Self-pay | Admitting: Family Medicine

## 2021-01-12 ENCOUNTER — Other Ambulatory Visit: Payer: Self-pay | Admitting: Family Medicine

## 2021-01-12 DIAGNOSIS — D179 Benign lipomatous neoplasm, unspecified: Secondary | ICD-10-CM

## 2021-01-16 DIAGNOSIS — M109 Gout, unspecified: Secondary | ICD-10-CM | POA: Diagnosis not present

## 2021-01-16 DIAGNOSIS — J209 Acute bronchitis, unspecified: Secondary | ICD-10-CM | POA: Diagnosis not present

## 2021-01-17 ENCOUNTER — Other Ambulatory Visit: Payer: Self-pay

## 2021-01-25 ENCOUNTER — Ambulatory Visit
Admission: RE | Admit: 2021-01-25 | Discharge: 2021-01-25 | Disposition: A | Discharge status: Home / Self Care | Payer: Medicare PPO | Source: Ambulatory Visit | Attending: Family Medicine | Admitting: Family Medicine

## 2021-01-25 DIAGNOSIS — R2242 Localized swelling, mass and lump, left lower limb: Secondary | ICD-10-CM | POA: Diagnosis not present

## 2021-01-25 DIAGNOSIS — D179 Benign lipomatous neoplasm, unspecified: Secondary | ICD-10-CM

## 2021-02-15 ENCOUNTER — Other Ambulatory Visit: Payer: Self-pay | Admitting: Family Medicine

## 2021-02-15 DIAGNOSIS — R2242 Localized swelling, mass and lump, left lower limb: Secondary | ICD-10-CM

## 2021-02-27 DIAGNOSIS — E785 Hyperlipidemia, unspecified: Secondary | ICD-10-CM | POA: Diagnosis not present

## 2021-03-09 ENCOUNTER — Other Ambulatory Visit: Payer: Self-pay

## 2021-03-09 ENCOUNTER — Ambulatory Visit
Admission: RE | Admit: 2021-03-09 | Discharge: 2021-03-09 | Disposition: A | Discharge status: Home / Self Care | Payer: Medicare PPO | Source: Ambulatory Visit | Attending: Family Medicine | Admitting: Family Medicine

## 2021-03-09 DIAGNOSIS — R2242 Localized swelling, mass and lump, left lower limb: Secondary | ICD-10-CM

## 2021-03-09 DIAGNOSIS — M1612 Unilateral primary osteoarthritis, left hip: Secondary | ICD-10-CM | POA: Diagnosis not present

## 2021-03-09 DIAGNOSIS — D1779 Benign lipomatous neoplasm of other sites: Secondary | ICD-10-CM | POA: Diagnosis not present

## 2021-03-09 MED ORDER — GADOBENATE DIMEGLUMINE 529 MG/ML IV SOLN
15.0000 mL | Freq: Once | INTRAVENOUS | Status: AC | PRN
  Administered 2021-03-09: 13:00:00 15 mL via INTRAVENOUS

## 2021-03-25 DIAGNOSIS — U071 COVID-19: Secondary | ICD-10-CM | POA: Diagnosis not present

## 2021-04-25 ENCOUNTER — Ambulatory Visit: Payer: Medicare PPO | Admitting: Cardiovascular Disease

## 2021-04-26 DIAGNOSIS — J0100 Acute maxillary sinusitis, unspecified: Secondary | ICD-10-CM | POA: Diagnosis not present

## 2021-05-09 DIAGNOSIS — D1724 Benign lipomatous neoplasm of skin and subcutaneous tissue of left leg: Secondary | ICD-10-CM | POA: Diagnosis not present

## 2021-05-28 DIAGNOSIS — J3089 Other allergic rhinitis: Secondary | ICD-10-CM | POA: Diagnosis not present

## 2021-05-28 DIAGNOSIS — J4540 Moderate persistent asthma, uncomplicated: Secondary | ICD-10-CM | POA: Diagnosis not present

## 2021-05-28 DIAGNOSIS — J301 Allergic rhinitis due to pollen: Secondary | ICD-10-CM | POA: Diagnosis not present

## 2021-05-28 DIAGNOSIS — K219 Gastro-esophageal reflux disease without esophagitis: Secondary | ICD-10-CM | POA: Diagnosis not present

## 2021-06-21 DIAGNOSIS — J0100 Acute maxillary sinusitis, unspecified: Secondary | ICD-10-CM | POA: Diagnosis not present

## 2021-07-04 ENCOUNTER — Ambulatory Visit: Payer: Medicare PPO | Admitting: Cardiovascular Disease

## 2021-07-25 DIAGNOSIS — D179 Benign lipomatous neoplasm, unspecified: Secondary | ICD-10-CM | POA: Diagnosis not present

## 2021-08-07 DIAGNOSIS — G479 Sleep disorder, unspecified: Secondary | ICD-10-CM | POA: Diagnosis not present

## 2021-08-07 DIAGNOSIS — E559 Vitamin D deficiency, unspecified: Secondary | ICD-10-CM | POA: Diagnosis not present

## 2021-08-07 DIAGNOSIS — Z0000 Encounter for general adult medical examination without abnormal findings: Secondary | ICD-10-CM | POA: Diagnosis not present

## 2021-08-07 DIAGNOSIS — L237 Allergic contact dermatitis due to plants, except food: Secondary | ICD-10-CM | POA: Diagnosis not present

## 2021-08-07 DIAGNOSIS — M353 Polymyalgia rheumatica: Secondary | ICD-10-CM | POA: Diagnosis not present

## 2021-08-07 DIAGNOSIS — Z79899 Other long term (current) drug therapy: Secondary | ICD-10-CM | POA: Diagnosis not present

## 2021-08-07 DIAGNOSIS — I10 Essential (primary) hypertension: Secondary | ICD-10-CM | POA: Diagnosis not present

## 2021-08-07 DIAGNOSIS — E785 Hyperlipidemia, unspecified: Secondary | ICD-10-CM | POA: Diagnosis not present

## 2021-08-07 DIAGNOSIS — D649 Anemia, unspecified: Secondary | ICD-10-CM | POA: Diagnosis not present

## 2021-08-08 ENCOUNTER — Other Ambulatory Visit: Payer: Self-pay | Admitting: Family Medicine

## 2021-08-08 DIAGNOSIS — E2839 Other primary ovarian failure: Secondary | ICD-10-CM

## 2021-08-30 DIAGNOSIS — F330 Major depressive disorder, recurrent, mild: Secondary | ICD-10-CM | POA: Diagnosis not present

## 2021-08-30 DIAGNOSIS — R2242 Localized swelling, mass and lump, left lower limb: Secondary | ICD-10-CM | POA: Diagnosis not present

## 2021-09-06 DIAGNOSIS — R2242 Localized swelling, mass and lump, left lower limb: Secondary | ICD-10-CM | POA: Diagnosis not present

## 2021-09-06 DIAGNOSIS — D1724 Benign lipomatous neoplasm of skin and subcutaneous tissue of left leg: Secondary | ICD-10-CM | POA: Diagnosis not present

## 2021-09-06 DIAGNOSIS — F330 Major depressive disorder, recurrent, mild: Secondary | ICD-10-CM | POA: Diagnosis not present

## 2021-09-06 DIAGNOSIS — G8918 Other acute postprocedural pain: Secondary | ICD-10-CM | POA: Diagnosis not present

## 2021-09-14 DIAGNOSIS — Z9889 Other specified postprocedural states: Secondary | ICD-10-CM | POA: Diagnosis not present

## 2021-09-26 DIAGNOSIS — Z483 Aftercare following surgery for neoplasm: Secondary | ICD-10-CM | POA: Diagnosis not present

## 2021-09-26 DIAGNOSIS — Z4802 Encounter for removal of sutures: Secondary | ICD-10-CM | POA: Diagnosis not present

## 2021-09-26 DIAGNOSIS — D1724 Benign lipomatous neoplasm of skin and subcutaneous tissue of left leg: Secondary | ICD-10-CM | POA: Diagnosis not present

## 2021-10-08 DIAGNOSIS — L820 Inflamed seborrheic keratosis: Secondary | ICD-10-CM | POA: Diagnosis not present

## 2021-10-08 DIAGNOSIS — L723 Sebaceous cyst: Secondary | ICD-10-CM | POA: Diagnosis not present

## 2021-10-08 DIAGNOSIS — L570 Actinic keratosis: Secondary | ICD-10-CM | POA: Diagnosis not present

## 2021-10-26 DIAGNOSIS — J0110 Acute frontal sinusitis, unspecified: Secondary | ICD-10-CM | POA: Diagnosis not present

## 2021-10-30 DIAGNOSIS — D1724 Benign lipomatous neoplasm of skin and subcutaneous tissue of left leg: Secondary | ICD-10-CM | POA: Diagnosis not present

## 2021-10-30 DIAGNOSIS — Z4789 Encounter for other orthopedic aftercare: Secondary | ICD-10-CM | POA: Diagnosis not present

## 2021-12-13 DIAGNOSIS — J0110 Acute frontal sinusitis, unspecified: Secondary | ICD-10-CM | POA: Diagnosis not present

## 2021-12-19 DIAGNOSIS — L245 Irritant contact dermatitis due to other chemical products: Secondary | ICD-10-CM | POA: Diagnosis not present

## 2021-12-19 DIAGNOSIS — L820 Inflamed seborrheic keratosis: Secondary | ICD-10-CM | POA: Diagnosis not present

## 2021-12-19 DIAGNOSIS — L570 Actinic keratosis: Secondary | ICD-10-CM | POA: Diagnosis not present

## 2022-04-09 DIAGNOSIS — J4541 Moderate persistent asthma with (acute) exacerbation: Secondary | ICD-10-CM | POA: Diagnosis not present

## 2022-04-09 DIAGNOSIS — R059 Cough, unspecified: Secondary | ICD-10-CM | POA: Diagnosis not present

## 2022-07-15 ENCOUNTER — Other Ambulatory Visit: Payer: Self-pay | Admitting: Family Medicine

## 2022-07-15 DIAGNOSIS — E2839 Other primary ovarian failure: Secondary | ICD-10-CM

## 2022-07-15 DIAGNOSIS — Z1231 Encounter for screening mammogram for malignant neoplasm of breast: Secondary | ICD-10-CM

## 2022-08-12 IMAGING — CR DG CHEST 2V
2 series · 2 of 2 positions shown · non-contrast
Comparison: None.

CLINICAL DATA: Shortness of breath

EXAM:
CHEST - 2 VIEW

[w chest pa]
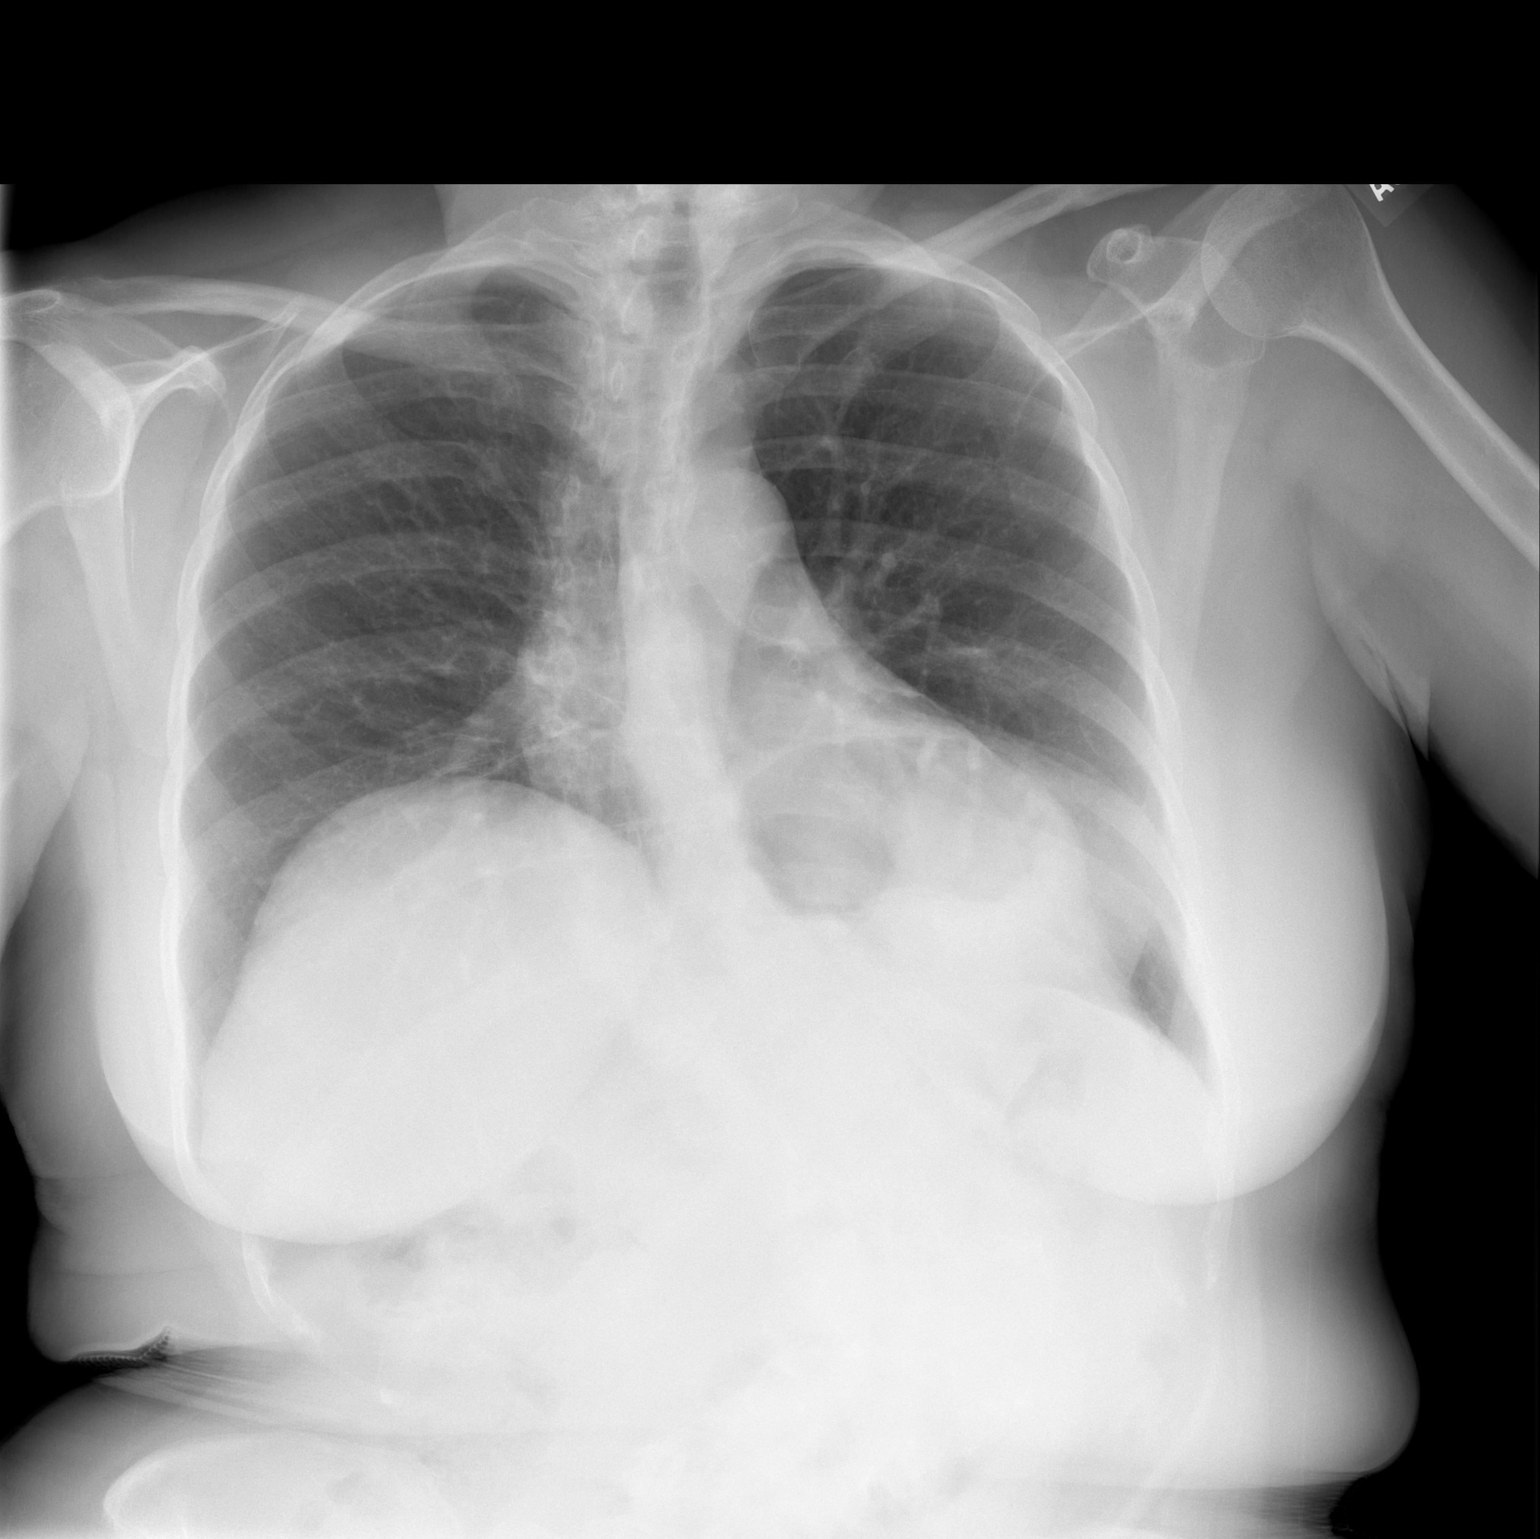

[w chest lat]
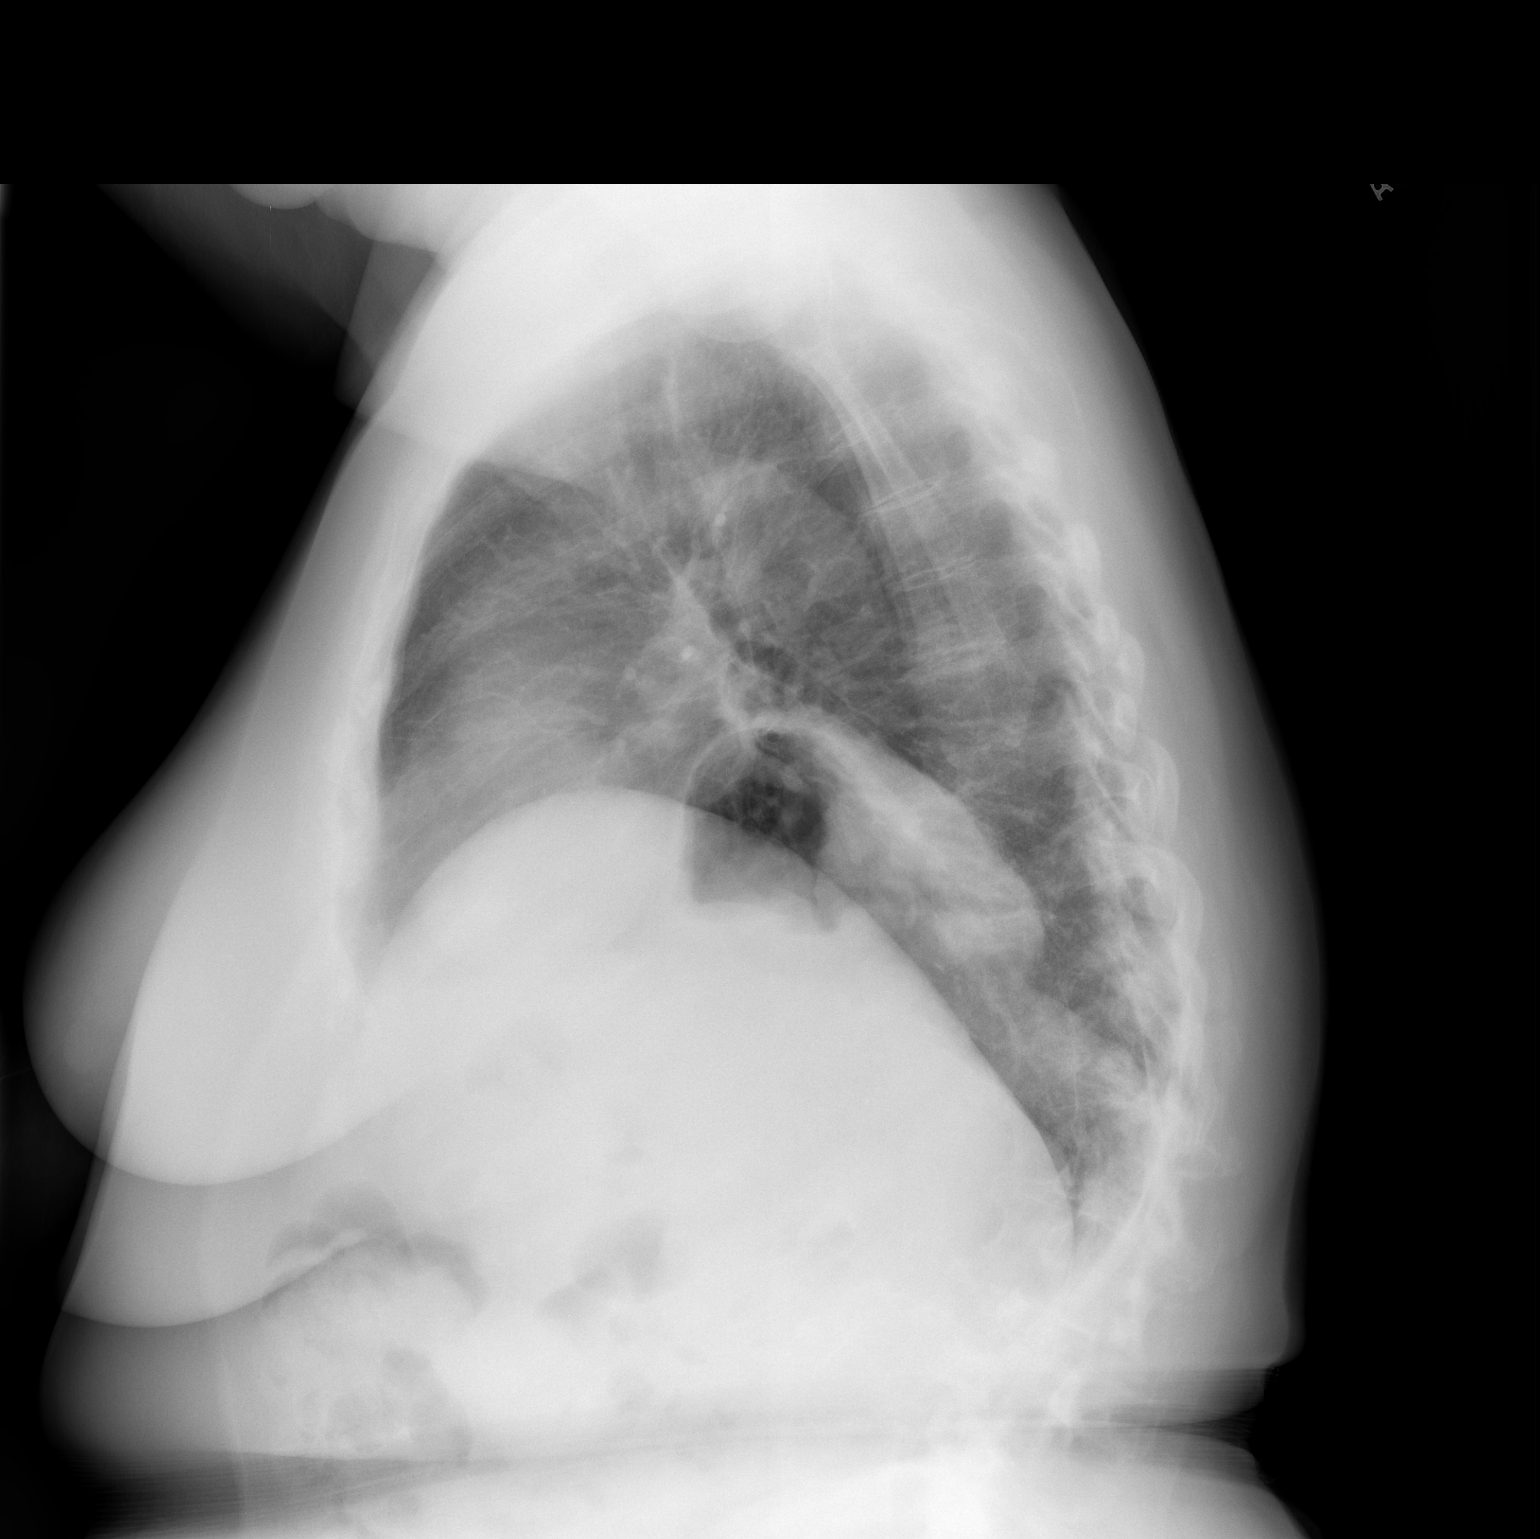

[2 of 2 positions shown; findings below may reference images not displayed]

FINDINGS: No pneumothorax. Eventration of the left hemidiaphragm. A portion of
the stomach may be located intrathoracic Bogojevic. The heart, hila,
mediastinum, lungs, and pleura are otherwise unremarkable.
IMPRESSION: Eventration of the left hemidiaphragm. A portion of the stomach may
be located intrathoracic Bogojevic. No other acute abnormalities.

## 2022-08-20 ENCOUNTER — Ambulatory Visit
Admission: RE | Admit: 2022-08-20 | Discharge: 2022-08-20 | Disposition: A | Discharge status: Home / Self Care | Payer: Medicare PPO | Source: Ambulatory Visit | Attending: Family Medicine | Admitting: Family Medicine

## 2022-08-20 DIAGNOSIS — Z1231 Encounter for screening mammogram for malignant neoplasm of breast: Secondary | ICD-10-CM

## 2022-09-06 DIAGNOSIS — E538 Deficiency of other specified B group vitamins: Secondary | ICD-10-CM | POA: Diagnosis not present

## 2022-09-06 DIAGNOSIS — E559 Vitamin D deficiency, unspecified: Secondary | ICD-10-CM | POA: Diagnosis not present

## 2022-09-06 DIAGNOSIS — Z79899 Other long term (current) drug therapy: Secondary | ICD-10-CM | POA: Diagnosis not present

## 2022-09-06 DIAGNOSIS — I471 Supraventricular tachycardia, unspecified: Secondary | ICD-10-CM | POA: Diagnosis not present

## 2022-09-06 DIAGNOSIS — F330 Major depressive disorder, recurrent, mild: Secondary | ICD-10-CM | POA: Diagnosis not present

## 2022-09-06 DIAGNOSIS — E785 Hyperlipidemia, unspecified: Secondary | ICD-10-CM | POA: Diagnosis not present

## 2022-09-06 DIAGNOSIS — M353 Polymyalgia rheumatica: Secondary | ICD-10-CM | POA: Diagnosis not present

## 2022-09-06 DIAGNOSIS — Z0000 Encounter for general adult medical examination without abnormal findings: Secondary | ICD-10-CM | POA: Diagnosis not present

## 2022-11-04 DIAGNOSIS — J4540 Moderate persistent asthma, uncomplicated: Secondary | ICD-10-CM | POA: Diagnosis not present

## 2022-11-04 DIAGNOSIS — J3089 Other allergic rhinitis: Secondary | ICD-10-CM | POA: Diagnosis not present

## 2022-11-04 DIAGNOSIS — J301 Allergic rhinitis due to pollen: Secondary | ICD-10-CM | POA: Diagnosis not present

## 2022-11-04 DIAGNOSIS — K219 Gastro-esophageal reflux disease without esophagitis: Secondary | ICD-10-CM | POA: Diagnosis not present

## 2022-12-10 DIAGNOSIS — E538 Deficiency of other specified B group vitamins: Secondary | ICD-10-CM | POA: Diagnosis not present

## 2022-12-10 DIAGNOSIS — G479 Sleep disorder, unspecified: Secondary | ICD-10-CM | POA: Diagnosis not present

## 2022-12-10 DIAGNOSIS — L299 Pruritus, unspecified: Secondary | ICD-10-CM | POA: Diagnosis not present

## 2022-12-10 DIAGNOSIS — Z23 Encounter for immunization: Secondary | ICD-10-CM | POA: Diagnosis not present

## 2023-01-29 ENCOUNTER — Ambulatory Visit
Admission: RE | Admit: 2023-01-29 | Discharge: 2023-01-29 | Disposition: A | Discharge status: Home / Self Care | Payer: Medicare PPO | Source: Ambulatory Visit | Attending: Family Medicine | Admitting: Family Medicine

## 2023-01-29 DIAGNOSIS — N958 Other specified menopausal and perimenopausal disorders: Secondary | ICD-10-CM | POA: Diagnosis not present

## 2023-01-29 DIAGNOSIS — M8588 Other specified disorders of bone density and structure, other site: Secondary | ICD-10-CM | POA: Diagnosis not present

## 2023-01-29 DIAGNOSIS — E2839 Other primary ovarian failure: Secondary | ICD-10-CM

## 2023-02-03 DIAGNOSIS — R7301 Impaired fasting glucose: Secondary | ICD-10-CM | POA: Diagnosis not present

## 2023-02-03 DIAGNOSIS — M353 Polymyalgia rheumatica: Secondary | ICD-10-CM | POA: Diagnosis not present

## 2023-02-03 DIAGNOSIS — N390 Urinary tract infection, site not specified: Secondary | ICD-10-CM | POA: Diagnosis not present

## 2023-02-03 DIAGNOSIS — Z23 Encounter for immunization: Secondary | ICD-10-CM | POA: Diagnosis not present

## 2023-02-12 DIAGNOSIS — M353 Polymyalgia rheumatica: Secondary | ICD-10-CM | POA: Diagnosis not present

## 2023-03-18 DIAGNOSIS — M25519 Pain in unspecified shoulder: Secondary | ICD-10-CM | POA: Diagnosis not present

## 2023-03-18 DIAGNOSIS — M79671 Pain in right foot: Secondary | ICD-10-CM | POA: Diagnosis not present

## 2023-03-18 DIAGNOSIS — M25552 Pain in left hip: Secondary | ICD-10-CM | POA: Diagnosis not present

## 2023-03-18 DIAGNOSIS — M79642 Pain in left hand: Secondary | ICD-10-CM | POA: Diagnosis not present

## 2023-03-18 DIAGNOSIS — M154 Erosive (osteo)arthritis: Secondary | ICD-10-CM | POA: Diagnosis not present

## 2023-03-18 DIAGNOSIS — M25512 Pain in left shoulder: Secondary | ICD-10-CM | POA: Diagnosis not present

## 2023-03-18 DIAGNOSIS — M25511 Pain in right shoulder: Secondary | ICD-10-CM | POA: Diagnosis not present

## 2023-03-18 DIAGNOSIS — M25559 Pain in unspecified hip: Secondary | ICD-10-CM | POA: Diagnosis not present

## 2023-03-18 DIAGNOSIS — M542 Cervicalgia: Secondary | ICD-10-CM | POA: Diagnosis not present

## 2023-03-18 DIAGNOSIS — M549 Dorsalgia, unspecified: Secondary | ICD-10-CM | POA: Diagnosis not present

## 2023-03-18 DIAGNOSIS — M1990 Unspecified osteoarthritis, unspecified site: Secondary | ICD-10-CM | POA: Diagnosis not present

## 2023-03-18 DIAGNOSIS — M79672 Pain in left foot: Secondary | ICD-10-CM | POA: Diagnosis not present

## 2023-03-18 DIAGNOSIS — M353 Polymyalgia rheumatica: Secondary | ICD-10-CM | POA: Diagnosis not present

## 2023-03-18 DIAGNOSIS — M79641 Pain in right hand: Secondary | ICD-10-CM | POA: Diagnosis not present

## 2023-03-18 DIAGNOSIS — M79643 Pain in unspecified hand: Secondary | ICD-10-CM | POA: Diagnosis not present

## 2023-03-18 DIAGNOSIS — M2550 Pain in unspecified joint: Secondary | ICD-10-CM | POA: Diagnosis not present

## 2023-03-18 DIAGNOSIS — M25551 Pain in right hip: Secondary | ICD-10-CM | POA: Diagnosis not present

## 2023-04-05 DIAGNOSIS — J324 Chronic pansinusitis: Secondary | ICD-10-CM | POA: Diagnosis not present

## 2023-04-05 DIAGNOSIS — J209 Acute bronchitis, unspecified: Secondary | ICD-10-CM | POA: Diagnosis not present

## 2023-04-08 IMAGING — MR MR FEMUR*L* WO/W CM
4 of 10 series · 14 of 40 positions shown · IV contrast (multihance)
Comparison: None.

CLINICAL DATA: Left thigh mass.

EXAM:
MR OF THE LEFT LOWER EXTREMITY WITHOUT AND WITH CONTRAST
TECHNIQUE: Multiplanar, multisequence MR imaging of the left thigh was
performed both before and after administration of intravenous
contrast.
CONTRAST:  15mL MULTIHANCE GADOBENATE DIMEGLUMINE 529 MG/ML IV SOLN

[Series 4: T1 · axial · left · 5.0mm · 0.34mm/px · z∈[-133,+191]mm · 3 of 55 slices shown (1 of 2)]
[im 1/55]
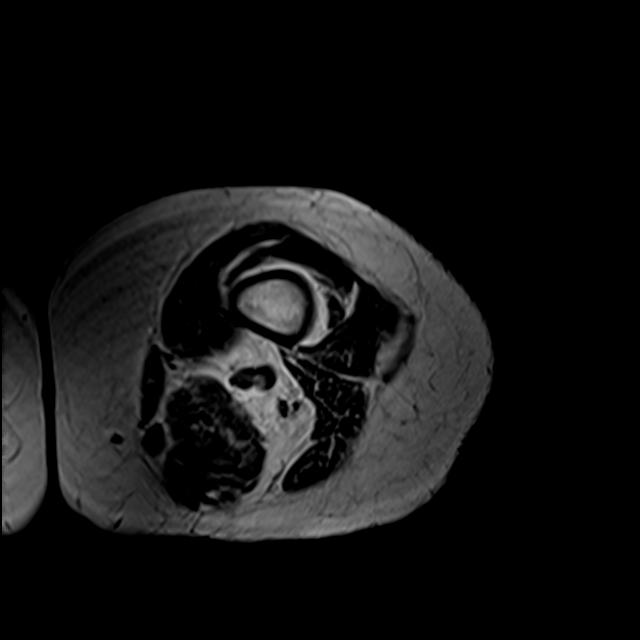
[im 37/55]
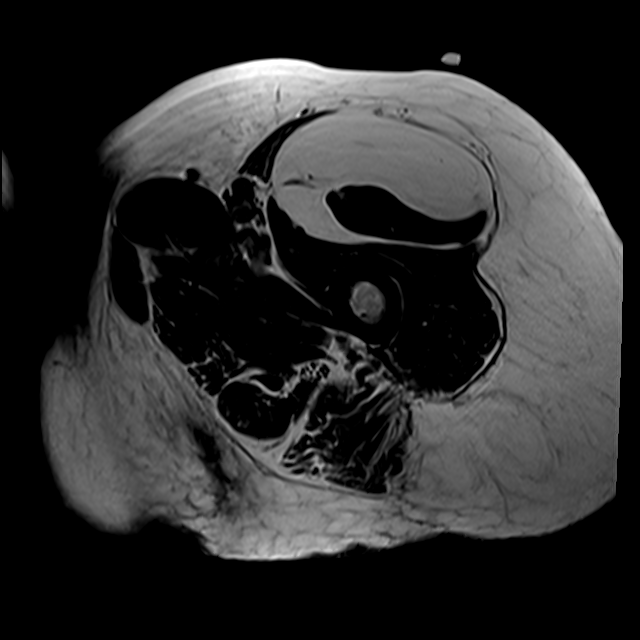
[im 55/55]
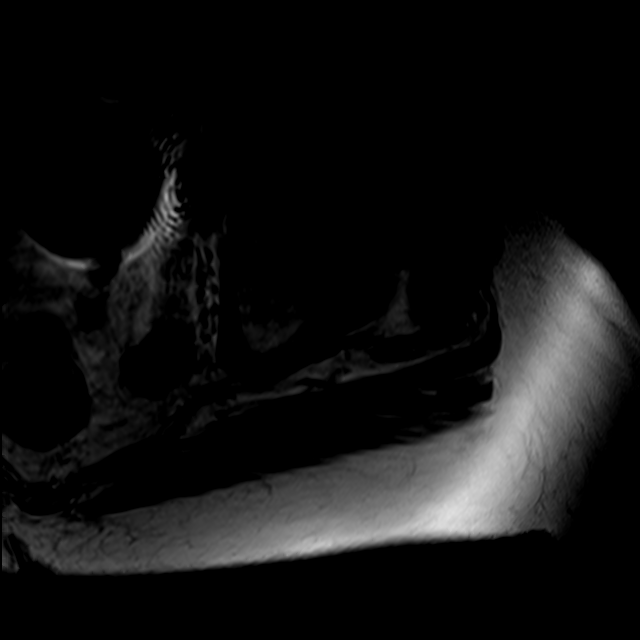

[Series 5: T2 fat-sat · axial · left · 5.0mm · 0.43mm/px · z∈[-133,+191]mm · 4 of 55 slices shown (1 of 2)]
[im 1/55]
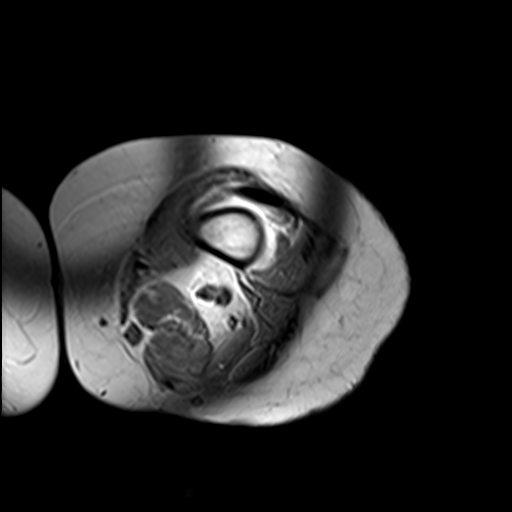
[im 19/55]
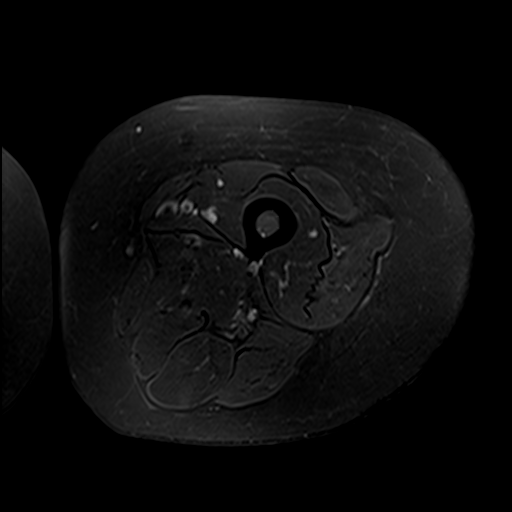
[im 37/55]
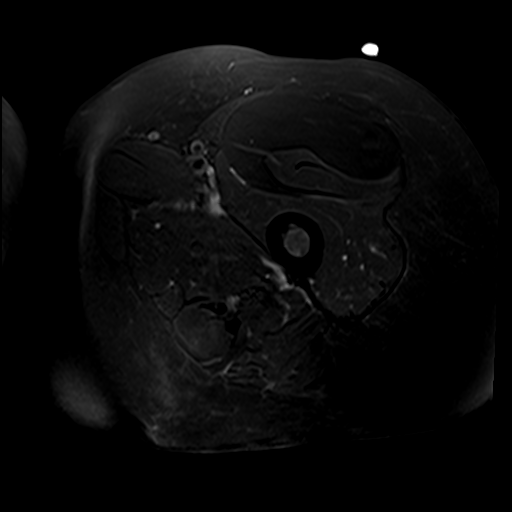
[im 55/55]
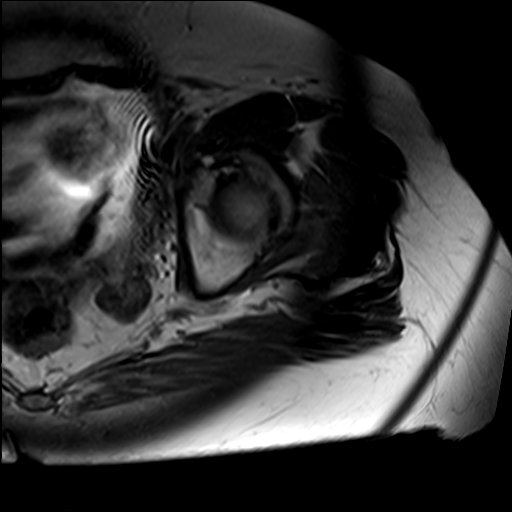

[Series 6: T1 · axial · left · 5.0mm · 0.34mm/px · z∈[-133,+191]mm · 3 of 55 slices shown (2 of 2)]
[im 1/55]
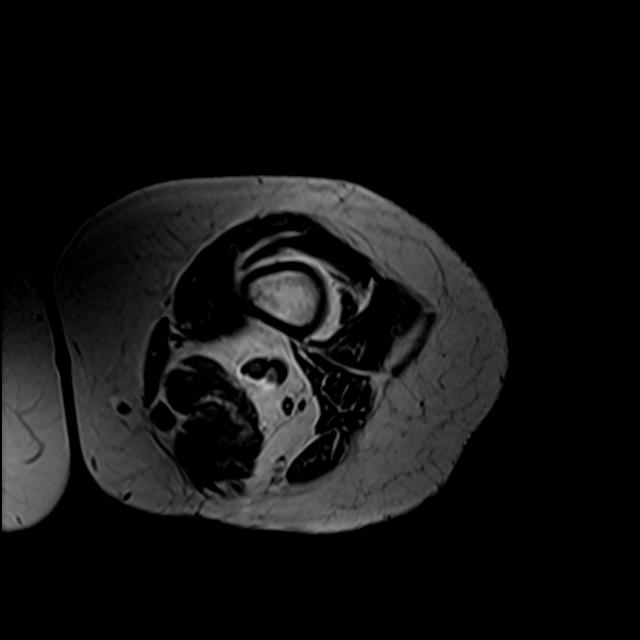
[im 28/55]
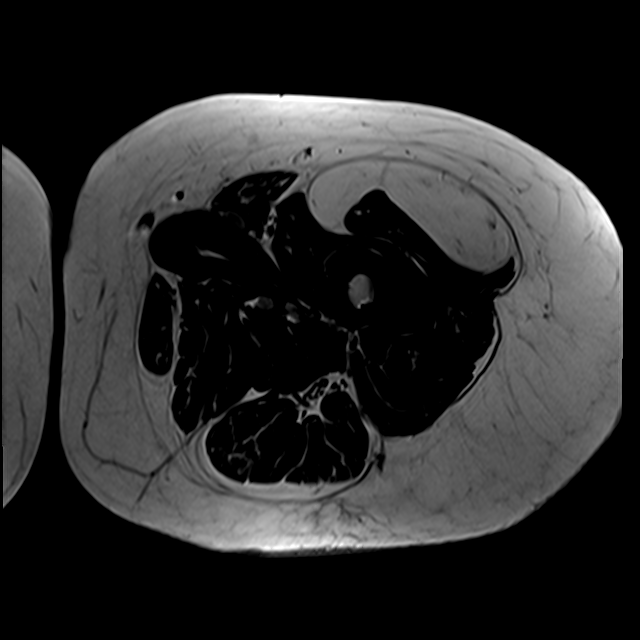
[im 55/55]
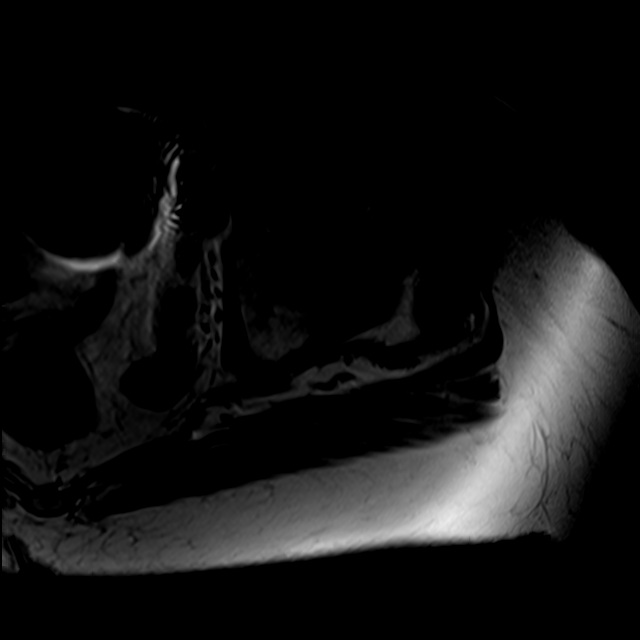

[Series 9: T2 fat-sat · sagittal · left · 3.0mm · 0.47mm/px · 4 of 49 slices shown (2 of 2)]
[im 1/49]
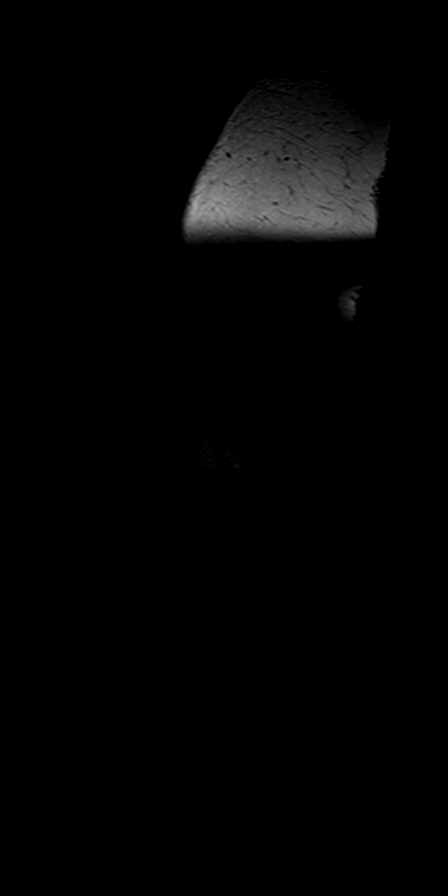
[im 17/49]
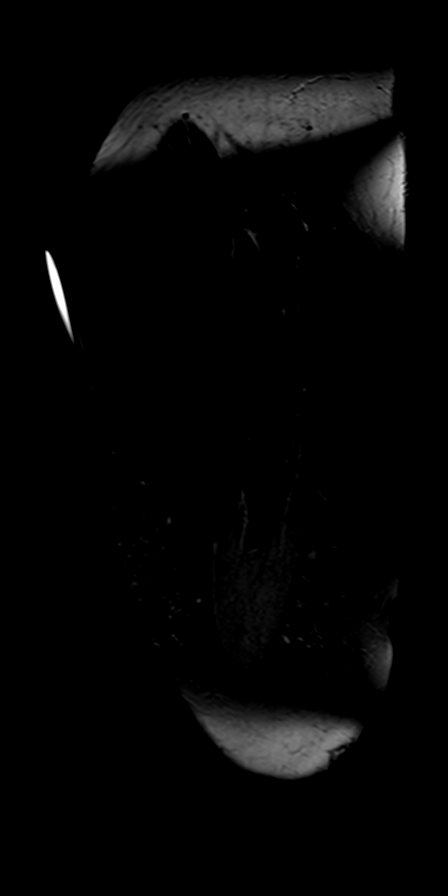
[im 33/49]
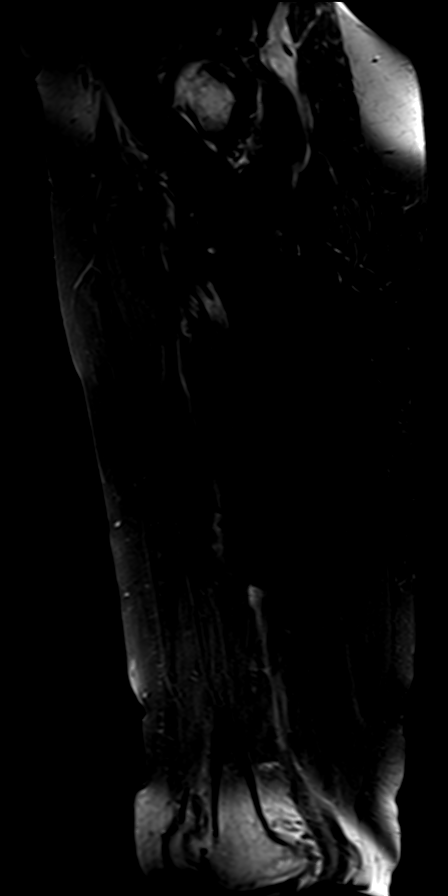
[im 49/49]
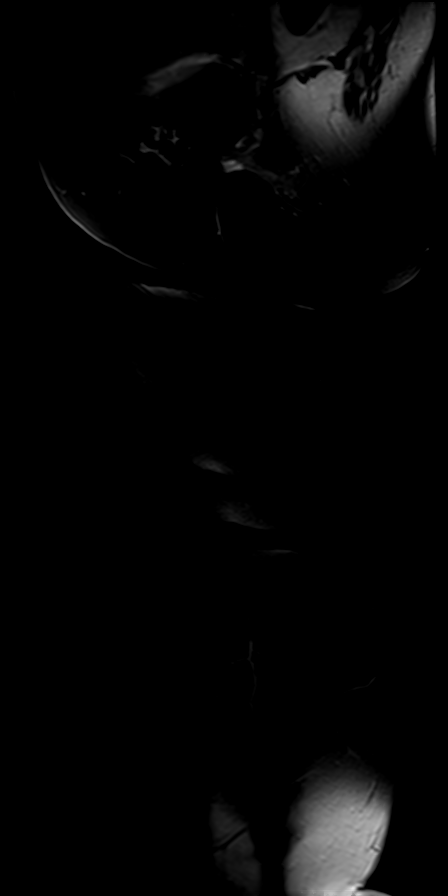

[14 of 40 positions shown; findings below may reference images not displayed]

FINDINGS: Bones/Joint/Cartilage

No fracture or dislocation. Normal alignment. No joint effusion. No
marrow signal abnormality. Osteoarthritis of bilateral hips
partially visualized.

Muscles and Tendons

2.5 x 7.8 x 18.7 cm lipomatous mass in the left rectus femoris
muscle a mural nodule 2 mm thick septation along the inferior margin
of the mass. Muscles are otherwise normal.

Soft tissue
No fluid collection or hematoma.  No soft tissue mass.
IMPRESSION: 1. A 2.5 x 7.8 x 18.7 cm lipomatous mass in the left rectus femoris
muscle with a single inferior septation measuring 2 mm most
consistent with an intramuscular lipoma versus atypical
lipoma/well-differentiated low-grade liposarcoma given the overall
size.

## 2023-05-19 DIAGNOSIS — M25519 Pain in unspecified shoulder: Secondary | ICD-10-CM | POA: Diagnosis not present

## 2023-05-19 DIAGNOSIS — M79643 Pain in unspecified hand: Secondary | ICD-10-CM | POA: Diagnosis not present

## 2023-05-19 DIAGNOSIS — M154 Erosive (osteo)arthritis: Secondary | ICD-10-CM | POA: Diagnosis not present

## 2023-05-19 DIAGNOSIS — M1990 Unspecified osteoarthritis, unspecified site: Secondary | ICD-10-CM | POA: Diagnosis not present

## 2023-05-19 DIAGNOSIS — M542 Cervicalgia: Secondary | ICD-10-CM | POA: Diagnosis not present

## 2023-05-19 DIAGNOSIS — M549 Dorsalgia, unspecified: Secondary | ICD-10-CM | POA: Diagnosis not present

## 2023-05-19 DIAGNOSIS — M353 Polymyalgia rheumatica: Secondary | ICD-10-CM | POA: Diagnosis not present

## 2023-05-19 DIAGNOSIS — M2550 Pain in unspecified joint: Secondary | ICD-10-CM | POA: Diagnosis not present

## 2023-05-19 DIAGNOSIS — M25559 Pain in unspecified hip: Secondary | ICD-10-CM | POA: Diagnosis not present

## 2023-06-04 DIAGNOSIS — F330 Major depressive disorder, recurrent, mild: Secondary | ICD-10-CM | POA: Diagnosis not present

## 2023-06-04 DIAGNOSIS — L299 Pruritus, unspecified: Secondary | ICD-10-CM | POA: Diagnosis not present

## 2023-06-04 DIAGNOSIS — R7301 Impaired fasting glucose: Secondary | ICD-10-CM | POA: Diagnosis not present

## 2023-06-04 DIAGNOSIS — M353 Polymyalgia rheumatica: Secondary | ICD-10-CM | POA: Diagnosis not present

## 2023-06-17 DIAGNOSIS — D225 Melanocytic nevi of trunk: Secondary | ICD-10-CM | POA: Diagnosis not present

## 2023-06-17 DIAGNOSIS — L821 Other seborrheic keratosis: Secondary | ICD-10-CM | POA: Diagnosis not present

## 2023-06-17 DIAGNOSIS — L570 Actinic keratosis: Secondary | ICD-10-CM | POA: Diagnosis not present

## 2023-06-17 DIAGNOSIS — D692 Other nonthrombocytopenic purpura: Secondary | ICD-10-CM | POA: Diagnosis not present

## 2023-08-23 ENCOUNTER — Encounter

## 2023-08-25 DIAGNOSIS — J069 Acute upper respiratory infection, unspecified: Secondary | ICD-10-CM | POA: Diagnosis not present

## 2023-08-25 DIAGNOSIS — J219 Acute bronchiolitis, unspecified: Secondary | ICD-10-CM | POA: Diagnosis not present

## 2023-09-11 DIAGNOSIS — M549 Dorsalgia, unspecified: Secondary | ICD-10-CM | POA: Diagnosis not present

## 2023-09-11 DIAGNOSIS — M1990 Unspecified osteoarthritis, unspecified site: Secondary | ICD-10-CM | POA: Diagnosis not present

## 2023-09-11 DIAGNOSIS — M542 Cervicalgia: Secondary | ICD-10-CM | POA: Diagnosis not present

## 2023-09-11 DIAGNOSIS — M154 Erosive (osteo)arthritis: Secondary | ICD-10-CM | POA: Diagnosis not present

## 2023-09-11 DIAGNOSIS — M2550 Pain in unspecified joint: Secondary | ICD-10-CM | POA: Diagnosis not present

## 2023-09-11 DIAGNOSIS — M25559 Pain in unspecified hip: Secondary | ICD-10-CM | POA: Diagnosis not present

## 2023-09-11 DIAGNOSIS — M353 Polymyalgia rheumatica: Secondary | ICD-10-CM | POA: Diagnosis not present

## 2023-09-11 DIAGNOSIS — M109 Gout, unspecified: Secondary | ICD-10-CM | POA: Diagnosis not present

## 2023-09-11 DIAGNOSIS — M79643 Pain in unspecified hand: Secondary | ICD-10-CM | POA: Diagnosis not present

## 2023-09-16 DIAGNOSIS — I679 Cerebrovascular disease, unspecified: Secondary | ICD-10-CM | POA: Diagnosis not present

## 2023-09-16 DIAGNOSIS — M109 Gout, unspecified: Secondary | ICD-10-CM | POA: Diagnosis not present

## 2023-09-16 DIAGNOSIS — E559 Vitamin D deficiency, unspecified: Secondary | ICD-10-CM | POA: Diagnosis not present

## 2023-09-16 DIAGNOSIS — I471 Supraventricular tachycardia, unspecified: Secondary | ICD-10-CM | POA: Diagnosis not present

## 2023-09-16 DIAGNOSIS — R7301 Impaired fasting glucose: Secondary | ICD-10-CM | POA: Diagnosis not present

## 2023-09-16 DIAGNOSIS — F331 Major depressive disorder, recurrent, moderate: Secondary | ICD-10-CM | POA: Diagnosis not present

## 2023-09-16 DIAGNOSIS — Z0000 Encounter for general adult medical examination without abnormal findings: Secondary | ICD-10-CM | POA: Diagnosis not present

## 2023-09-16 DIAGNOSIS — E785 Hyperlipidemia, unspecified: Secondary | ICD-10-CM | POA: Diagnosis not present

## 2023-09-16 DIAGNOSIS — Z79899 Other long term (current) drug therapy: Secondary | ICD-10-CM | POA: Diagnosis not present

## 2023-09-16 DIAGNOSIS — E538 Deficiency of other specified B group vitamins: Secondary | ICD-10-CM | POA: Diagnosis not present

## 2023-10-07 DIAGNOSIS — Z03818 Encounter for observation for suspected exposure to other biological agents ruled out: Secondary | ICD-10-CM | POA: Diagnosis not present

## 2023-10-07 DIAGNOSIS — J069 Acute upper respiratory infection, unspecified: Secondary | ICD-10-CM | POA: Diagnosis not present

## 2023-10-07 DIAGNOSIS — F331 Major depressive disorder, recurrent, moderate: Secondary | ICD-10-CM | POA: Diagnosis not present

## 2023-10-07 DIAGNOSIS — G479 Sleep disorder, unspecified: Secondary | ICD-10-CM | POA: Diagnosis not present

## 2023-10-07 DIAGNOSIS — U071 COVID-19: Secondary | ICD-10-CM | POA: Diagnosis not present

## 2023-10-07 DIAGNOSIS — R519 Headache, unspecified: Secondary | ICD-10-CM | POA: Diagnosis not present

## 2023-11-04 DIAGNOSIS — J4540 Moderate persistent asthma, uncomplicated: Secondary | ICD-10-CM | POA: Diagnosis not present

## 2023-11-04 DIAGNOSIS — J301 Allergic rhinitis due to pollen: Secondary | ICD-10-CM | POA: Diagnosis not present

## 2023-11-04 DIAGNOSIS — K219 Gastro-esophageal reflux disease without esophagitis: Secondary | ICD-10-CM | POA: Diagnosis not present

## 2023-11-04 DIAGNOSIS — J3089 Other allergic rhinitis: Secondary | ICD-10-CM | POA: Diagnosis not present
